# Patient Record
Sex: Male | Born: 1955 | Race: White | Hispanic: No | Marital: Married | Smoking: Never smoker
Health system: Southern US, Community
[De-identification: ages and names within clinical notes are randomized; demographics above are authoritative.]

## PROBLEM LIST (undated history)

## (undated) DIAGNOSIS — I10 Essential (primary) hypertension: Secondary | ICD-10-CM

## (undated) DIAGNOSIS — E7800 Pure hypercholesterolemia, unspecified: Secondary | ICD-10-CM

## (undated) HISTORY — PX: HIP SURGERY: SHX245

---

## 2003-08-12 ENCOUNTER — Inpatient Hospital Stay (HOSPITAL_COMMUNITY): Admission: RE | Admit: 2003-08-12 | Discharge: 2003-08-16 | Payer: Self-pay | Admitting: Orthopaedic Surgery

## 2004-09-18 ENCOUNTER — Ambulatory Visit (HOSPITAL_COMMUNITY): Admission: RE | Admit: 2004-09-18 | Discharge: 2004-09-18 | Payer: Self-pay | Admitting: General Surgery

## 2006-09-10 ENCOUNTER — Inpatient Hospital Stay (HOSPITAL_COMMUNITY): Admission: RE | Admit: 2006-09-10 | Discharge: 2006-09-12 | Payer: Self-pay | Admitting: Orthopaedic Surgery

## 2012-06-12 ENCOUNTER — Emergency Department (HOSPITAL_COMMUNITY): Payer: No Typology Code available for payment source

## 2012-06-12 ENCOUNTER — Emergency Department (HOSPITAL_COMMUNITY)
Admission: EM | Admit: 2012-06-12 | Discharge: 2012-06-12 | Disposition: A | Discharge status: Home / Self Care | Payer: No Typology Code available for payment source | Attending: Emergency Medicine | Admitting: Emergency Medicine

## 2012-06-12 ENCOUNTER — Encounter (HOSPITAL_COMMUNITY): Payer: Self-pay

## 2012-06-12 DIAGNOSIS — Y9210 Unspecified residential institution as the place of occurrence of the external cause: Secondary | ICD-10-CM | POA: Insufficient documentation

## 2012-06-12 DIAGNOSIS — S91009A Unspecified open wound, unspecified ankle, initial encounter: Secondary | ICD-10-CM | POA: Insufficient documentation

## 2012-06-12 DIAGNOSIS — Z23 Encounter for immunization: Secondary | ICD-10-CM | POA: Insufficient documentation

## 2012-06-12 DIAGNOSIS — I10 Essential (primary) hypertension: Secondary | ICD-10-CM | POA: Insufficient documentation

## 2012-06-12 DIAGNOSIS — S81009A Unspecified open wound, unspecified knee, initial encounter: Secondary | ICD-10-CM | POA: Insufficient documentation

## 2012-06-12 DIAGNOSIS — E7800 Pure hypercholesterolemia, unspecified: Secondary | ICD-10-CM | POA: Insufficient documentation

## 2012-06-12 DIAGNOSIS — Z79899 Other long term (current) drug therapy: Secondary | ICD-10-CM | POA: Insufficient documentation

## 2012-06-12 DIAGNOSIS — S81859A Open bite, unspecified lower leg, initial encounter: Secondary | ICD-10-CM

## 2012-06-12 DIAGNOSIS — W540XXA Bitten by dog, initial encounter: Secondary | ICD-10-CM | POA: Insufficient documentation

## 2012-06-12 DIAGNOSIS — Y9301 Activity, walking, marching and hiking: Secondary | ICD-10-CM | POA: Insufficient documentation

## 2012-06-12 HISTORY — DX: Essential (primary) hypertension: I10

## 2012-06-12 HISTORY — DX: Pure hypercholesterolemia, unspecified: E78.00

## 2012-06-12 MED ORDER — TETANUS-DIPHTH-ACELL PERTUSSIS 5-2.5-18.5 LF-MCG/0.5 IM SUSP
0.5000 mL | Freq: Once | INTRAMUSCULAR | Status: AC
  Administered 2012-06-12: 0.5 mL via INTRAMUSCULAR
  Filled 2012-06-12: qty 0.5

## 2012-06-12 MED ORDER — AMOXICILLIN-POT CLAVULANATE 875-125 MG PO TABS: 1.0000 | ORAL_TABLET | Freq: Two times a day (BID) | ORAL | Status: DC

## 2012-06-12 MED ORDER — RABIES IMMUNE GLOBULIN 150 UNIT/ML IM INJ
20.0000 [IU]/kg | INJECTION | Freq: Once | INTRAMUSCULAR | Status: AC
  Administered 2012-06-12: 1500 [IU]
  Filled 2012-06-12: qty 11.5

## 2012-06-12 MED ORDER — AMOXICILLIN-POT CLAVULANATE 875-125 MG PO TABS
1.0000 | ORAL_TABLET | Freq: Once | ORAL | Status: AC
  Administered 2012-06-12: 1 via ORAL
  Filled 2012-06-12: qty 1

## 2012-06-12 MED ORDER — RABIES VACCINE, PCEC IM SUSR
1.0000 mL | Freq: Once | INTRAMUSCULAR | Status: AC
  Administered 2012-06-12: 1 mL via INTRAMUSCULAR
  Filled 2012-06-12: qty 1

## 2012-06-12 NOTE — ED Notes (Signed)
Patient transported to X-ray 

## 2012-06-12 NOTE — ED Notes (Addendum)
Pt undressed from waist down, in gown, on continuous pulse oximetry and blood pressure cuff; blanket provided

## 2012-06-12 NOTE — ED Notes (Signed)
Pt was at vet office, walking thru lobby, dog on leash grabbed left lower leg, vaccines not up to date,

## 2012-06-12 NOTE — ED Notes (Signed)
Holding infiltration dose until better hemostasis, Xray notified pt ready for films

## 2012-06-12 NOTE — ED Notes (Signed)
Attempted to remove pressure dressing to give infiltration dose, blood squirted out of wound, pressure dressing reapplied and Dr Rulon Abide made aware, lac tray and lidocaine and 4.0 proline at the bedisde

## 2012-06-12 NOTE — ED Notes (Addendum)
Report from Vet clinic that animal has been collected by animal control, infiltration complete, no bleeding noted

## 2012-06-14 NOTE — ED Provider Notes (Addendum)
History     CSN: [id number]  Arrival date & time 06/12/12  1117   First MD Initiated Contact with Patient 06/12/12 1150      Chief Complaint  Patient presents with  . Animal Bite    (Consider location/radiation/quality/duration/timing/severity/associated sxs/prior treatment) HPIDouglas C Thompson is a 56 y.o. male who was at a veterinarian's office walking through the lobby when a lesion white pitbull lashed out and bit him on the left lower leg. Patient was treated at the veterinarian's office with irrigation and the wound was wrapped in a compressive dressing. Patient said the dog was not recently vaccinated for rabies.  Patient's having severe pain about 8-9/10 it is sharp and well localized, constant. He's got no coolness of the extremity distal to the wound, and no numbness or tingling of the left foot. This had no difficulties walking on the foot and no noticeable foot drop. He was otherwise feeling well before this incident today.   Past Medical History  Diagnosis Date  . Hypertension   . Hypercholesteremia     No past surgical history on file.  No family history on file.  History  Substance Use Topics  . Smoking status: Never Smoker   . Smokeless tobacco: Not on file  . Alcohol Use: Yes      Review of Systems At least 10pt or greater review of systems completed and are negative except where specified in the HPI.  Allergies  Review of patient's allergies indicates no known allergies.  Home Medications   Current Outpatient Rx  Name  Route  Sig  Dispense  Refill  . HYDROCHLOROTHIAZIDE 25 MG PO TABS   Oral   Take 25 mg by mouth daily.         Marland Kitchen SIMVASTATIN 20 MG PO TABS   Oral   Take 20 mg by mouth every evening.         Marland Kitchen AMOXICILLIN-POT CLAVULANATE 875-125 MG PO TABS   Oral   Take 1 tablet by mouth every 12 (twelve) hours.   14 tablet   0     BP 168/96  Pulse 88  Temp 98.3 F (36.8 C) (Oral)  Resp 20  Wt 190 lb (86.183 kg)  SpO2  99%  Physical Exam  Nursing notes reviewed.  Electronic medical record reviewed. VITAL SIGNS:   Filed Vitals:   06/12/12 1123 06/12/12 1300 06/12/12 1455  BP: 168/96    Pulse: 88    Temp: 98.3 F (36.8 C)    TempSrc: Oral    Resp: 20    Weight:  190 lb (86.183 kg) 190 lb (86.183 kg)  SpO2: 99%     CONSTITUTIONAL: Awake, oriented, appears non-toxic HENT: Atraumatic, normocephalic, oral mucosa pink and moist, airway patent. Nares patent without drainage. External ears normal. EYES: Conjunctiva clear, EOMI, PERRLA NECK: Trachea midline, non-tender, supple CARDIOVASCULAR: Normal heart rate, Normal rhythm, No murmurs, rubs, gallops PULMONARY/CHEST: Clear to auscultation, no rhonchi, wheezes, or rales. Symmetrical breath sounds. Non-tender. ABDOMINAL: Non-distended, soft, non-tender - no rebound or guarding.  BS normal. NEUROLOGIC: Non-focal, moving all four extremities, no gross sensory or motor deficits. EXTREMITIES: No clubbing, cyanosis, or edema. Deep puncture and avulsion wound to the left lateral lower leg. About 4 cm in length with 2 large puncture holes connected by a avulsed open tissue. There is brisk venous bleeding from this region. DP and PT pulses are 2+ bilaterally. Sensation is intact to the left lower extremity. He can dorsiflex and plantar flex the ankle and  toes SKIN: Warm, Dry, No erythema, No rash  ED Course  LACERATION REPAIR Performed by: Jones Skene Authorized by: Jones Skene Consent: Verbal consent obtained. Risks and benefits: risks, benefits and alternatives were discussed Consent given by: patient Body area: lower extremity Location details: left lower leg Laceration length: 4 cm Contamination: The wound is contaminated. (Dog bite) Foreign bodies: no foreign bodies Tendon involvement: none Nerve involvement: none Vascular damage: Brisk venous bleeding. Anesthesia: local infiltration Local anesthetic: lidocaine 2% with epinephrine Anesthetic  total: 10 ml Patient sedated: no Debridement: none Degree of undermining: none Skin closure: 4-0 Prolene Number of sutures: 1 Technique: simple (Horizontal mattress) Approximation difficulty: simple Dressing: pressure dressing Patient tolerance: Patient tolerated the procedure well with no immediate complications.   (including critical care time)  Labs Reviewed - No data to display No results found.   1. Animal bite of lower leg   2. Need for rabies vaccination       MDM  CHUCK CABAN is a 56 y.o. male presenting with some brisk bleeding from a dog bite wound. This was rinsed out and irrigated at the veterinary office. Patient has lost approximately 100 cc of blood from the wound which is also further cleaned it. Try to control her eating with a pressure dressing however it continued to bleed therefore he went to x-ray. Placed a single horizontal mattress stitch which controlled bleeding. Nursing placed rabies immunoglobulin in the wound after stitch was placed. No foreign body seen on x-ray. Patient given Augmentin in the ER a prescription for the same for 10 days. Patient was also given first of 4 rabies vaccines.  Patient will followup with urgent care for his continued rabies vaccine.  Is given very strict precautions to return in case the wound starts looking infected including but not limited to redness, worsening pain, streaks, draining pus. On the day of his last vaccine, told them to have them removed the single horizontal mattress stitch. Patient understands he may stop the rabies series it is confirmed that the dog is low risk or has been vaccinated.  I explained the diagnosis and have given explicit precautions to return to the ER including signs of infection as discussed before or any other new or worsening symptoms. The patient understands and accepts the medical plan as it's been dictated and I have answered their questions. Discharge instructions concerning home care  and prescriptions have been given.  The patient is STABLE and is discharged to home in good condition.      Jones Skene, MD 06/14/12 1616  Jones Skene, MD 06/16/12 1700

## 2012-06-17 ENCOUNTER — Emergency Department (INDEPENDENT_AMBULATORY_CARE_PROVIDER_SITE_OTHER)
Admission: EM | Admit: 2012-06-17 | Discharge: 2012-06-17 | Disposition: A | Discharge status: Home / Self Care | Payer: Commercial Managed Care - PPO | Source: Home / Self Care

## 2012-06-17 ENCOUNTER — Encounter (HOSPITAL_COMMUNITY): Payer: Self-pay | Admitting: *Deleted

## 2012-06-17 DIAGNOSIS — T148XXA Other injury of unspecified body region, initial encounter: Secondary | ICD-10-CM

## 2012-06-17 DIAGNOSIS — Z23 Encounter for immunization: Secondary | ICD-10-CM

## 2012-06-17 DIAGNOSIS — Z203 Contact with and (suspected) exposure to rabies: Secondary | ICD-10-CM

## 2012-06-17 DIAGNOSIS — W540XXA Bitten by dog, initial encounter: Secondary | ICD-10-CM

## 2012-06-17 MED ORDER — RABIES VACCINE, PCEC IM SUSR
INTRAMUSCULAR | Status: AC
  Filled 2012-06-17: qty 1

## 2012-06-17 MED ORDER — RABIES VACCINE, PCEC IM SUSR
1.0000 mL | Freq: Once | INTRAMUSCULAR | Status: AC
  Administered 2012-06-17: 1 mL via INTRAMUSCULAR

## 2012-06-17 NOTE — ED Notes (Signed)
Pt       Here   For  Rabies  Vaccine      Here  For         2  nd        Injection  Pt  Has   Sutures  In place     l  Lower  Leg

## 2012-06-17 NOTE — ED Notes (Signed)
Rabies schedule completed.  Injection dates adjusted to complete series in 14 day time period as pt hasn't rcvd day 3 shot.  Schedule faxed to [facility] and pharmacy x 2.  Note sent to [facility] asking them to provide pt with schedule when he returns today.

## 2012-06-21 ENCOUNTER — Encounter (HOSPITAL_COMMUNITY): Payer: Self-pay | Admitting: *Deleted

## 2012-06-21 ENCOUNTER — Emergency Department (HOSPITAL_COMMUNITY)
Admission: EM | Admit: 2012-06-21 | Discharge: 2012-06-21 | Disposition: A | Discharge status: Home / Self Care | Payer: Commercial Managed Care - PPO | Source: Home / Self Care | Attending: Emergency Medicine | Admitting: Emergency Medicine

## 2012-06-21 DIAGNOSIS — W540XXA Bitten by dog, initial encounter: Secondary | ICD-10-CM

## 2012-06-21 DIAGNOSIS — T148XXA Other injury of unspecified body region, initial encounter: Secondary | ICD-10-CM

## 2012-06-21 DIAGNOSIS — Z4802 Encounter for removal of sutures: Secondary | ICD-10-CM

## 2012-06-21 DIAGNOSIS — Z23 Encounter for immunization: Secondary | ICD-10-CM

## 2012-06-21 MED ORDER — RABIES VACCINE, PCEC IM SUSR
INTRAMUSCULAR | Status: AC
  Filled 2012-06-21: qty 1

## 2012-06-21 MED ORDER — RABIES VACCINE, PCEC IM SUSR
1.0000 mL | Freq: Once | INTRAMUSCULAR | Status: AC
  Administered 2012-06-21: 1 mL via INTRAMUSCULAR

## 2012-06-21 NOTE — ED Provider Notes (Signed)
Chief Complaint  Patient presents with  . Wound Check    History of Present Illness:  Billy Thompson is a 56 year old male who was bitten on his left leg by a dog on November 21. He was seen at the emergency room and a single stitch was put in to help stop the bleeding. Placed on antibiotics and a rabies series was begun. He returns today for suture removal. It's been healing up well without any evidence of infection. He's finished up his antibiotics. He did well with his first 2 rabies vaccines.  Review of Systems:  Other than noted above, the patient denies any of the following symptoms: Systemic:  No fever or chills. Musculoskeletal:  No joint pain or decreased range of motion. Neuro:  No numbness, tingling, or weakness.  PMFSH:  Past medical history, family history, social history, meds, and allergies were reviewed.  Physical Exam:   Vital signs:  BP 165/102  Pulse 66  Temp 98.1 F (36.7 C) (Oral)  Resp 16  SpO2 100% Ext:  He has a jagged laceration on his left, lower, lateral leg which appears to be healing up well without any evidence of infection. There is a single stitch in place.  All joints had a full ROM without pain.  Pulses were full.  Good capillary refill in all digits.  No edema. Neurological:  Alert and oriented.  No muscle weakness.  Sensation was intact to light touch.   Procedure: Verbal informed consent was obtained.  The patient was informed of the risks and benefits of the procedure and understands and accepts.  Identity of the patient was verified verbally and by wristband. The laceration was prepped with alcohol and the stitch was removed. Pressure was used to stop small amount of bleeding that occurred afterwards. Thereafter antibiotic ointment and a sterile pressure dressing were applied and he was instructed in wound care.  Medications given in [facility]:  He was given his third rabies vaccine today.  Assessment:  The encounter diagnosis was Dog bite.  Plan:   1.  The  following meds were prescribed:   New Prescriptions   No medications on file   2.  The patient was instructed in wound care and pain control, and handouts were given. 3.  The patient was told to return if any sign of infection, and also for his final rabies vaccine.     Reuben Likes, MD 06/21/12 [phone]

## 2012-06-21 NOTE — ED Notes (Signed)
Pt  Here  For  Rabies  Injection  As  Well  As   A   Suture  Removal

## 2012-06-21 NOTE — ED Notes (Signed)
No pain

## 2012-06-26 ENCOUNTER — Encounter (HOSPITAL_COMMUNITY): Payer: Self-pay | Admitting: Emergency Medicine

## 2012-06-26 ENCOUNTER — Emergency Department (INDEPENDENT_AMBULATORY_CARE_PROVIDER_SITE_OTHER)
Admission: EM | Admit: 2012-06-26 | Discharge: 2012-06-26 | Disposition: A | Discharge status: Home / Self Care | Payer: Commercial Managed Care - PPO | Source: Home / Self Care

## 2012-06-26 DIAGNOSIS — Z23 Encounter for immunization: Secondary | ICD-10-CM

## 2012-06-26 MED ORDER — RABIES VACCINE, PCEC IM SUSR
INTRAMUSCULAR | Status: AC
  Filled 2012-06-26: qty 1

## 2012-06-26 MED ORDER — RABIES VACCINE, PCEC IM SUSR
1.0000 mL | Freq: Once | INTRAMUSCULAR | Status: AC
  Administered 2012-06-26: 1 mL via INTRAMUSCULAR

## 2012-06-26 NOTE — ED Notes (Signed)
Pt here for rabies injection only.

## 2014-05-23 IMAGING — CR DG TIBIA/FIBULA 2V*L*
4 series · 4 of 4 positions shown · non-contrast
Comparison: None.

CLINICAL DATA: Dog bite to the lateral distal left lower leg

LEFT TIBIA AND FIBULA - 2 VIEW

[x tib-fib ap left (1 of 2)]
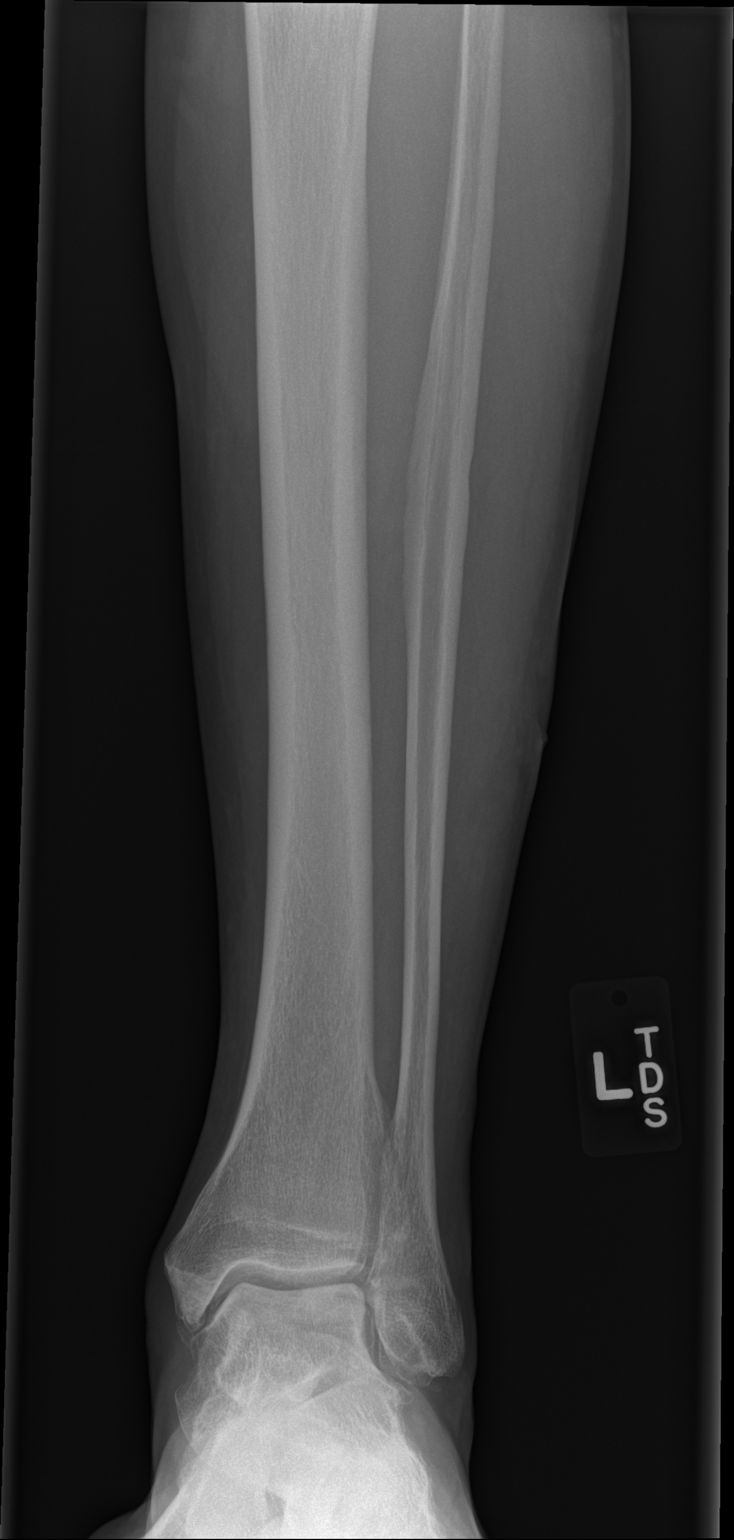

[x tib-fib ap left (2 of 2)]
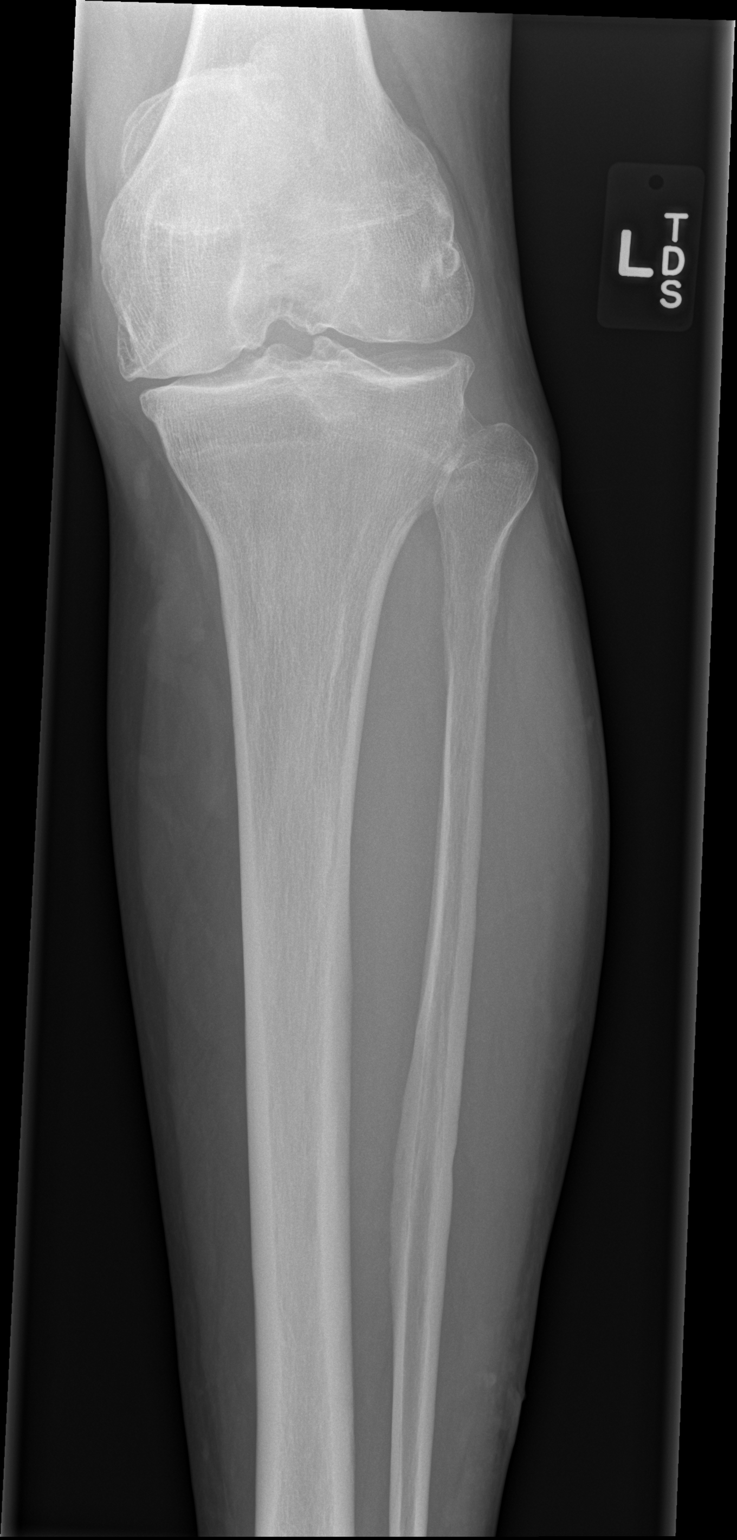

[x tib-fib lat left (1 of 2)]
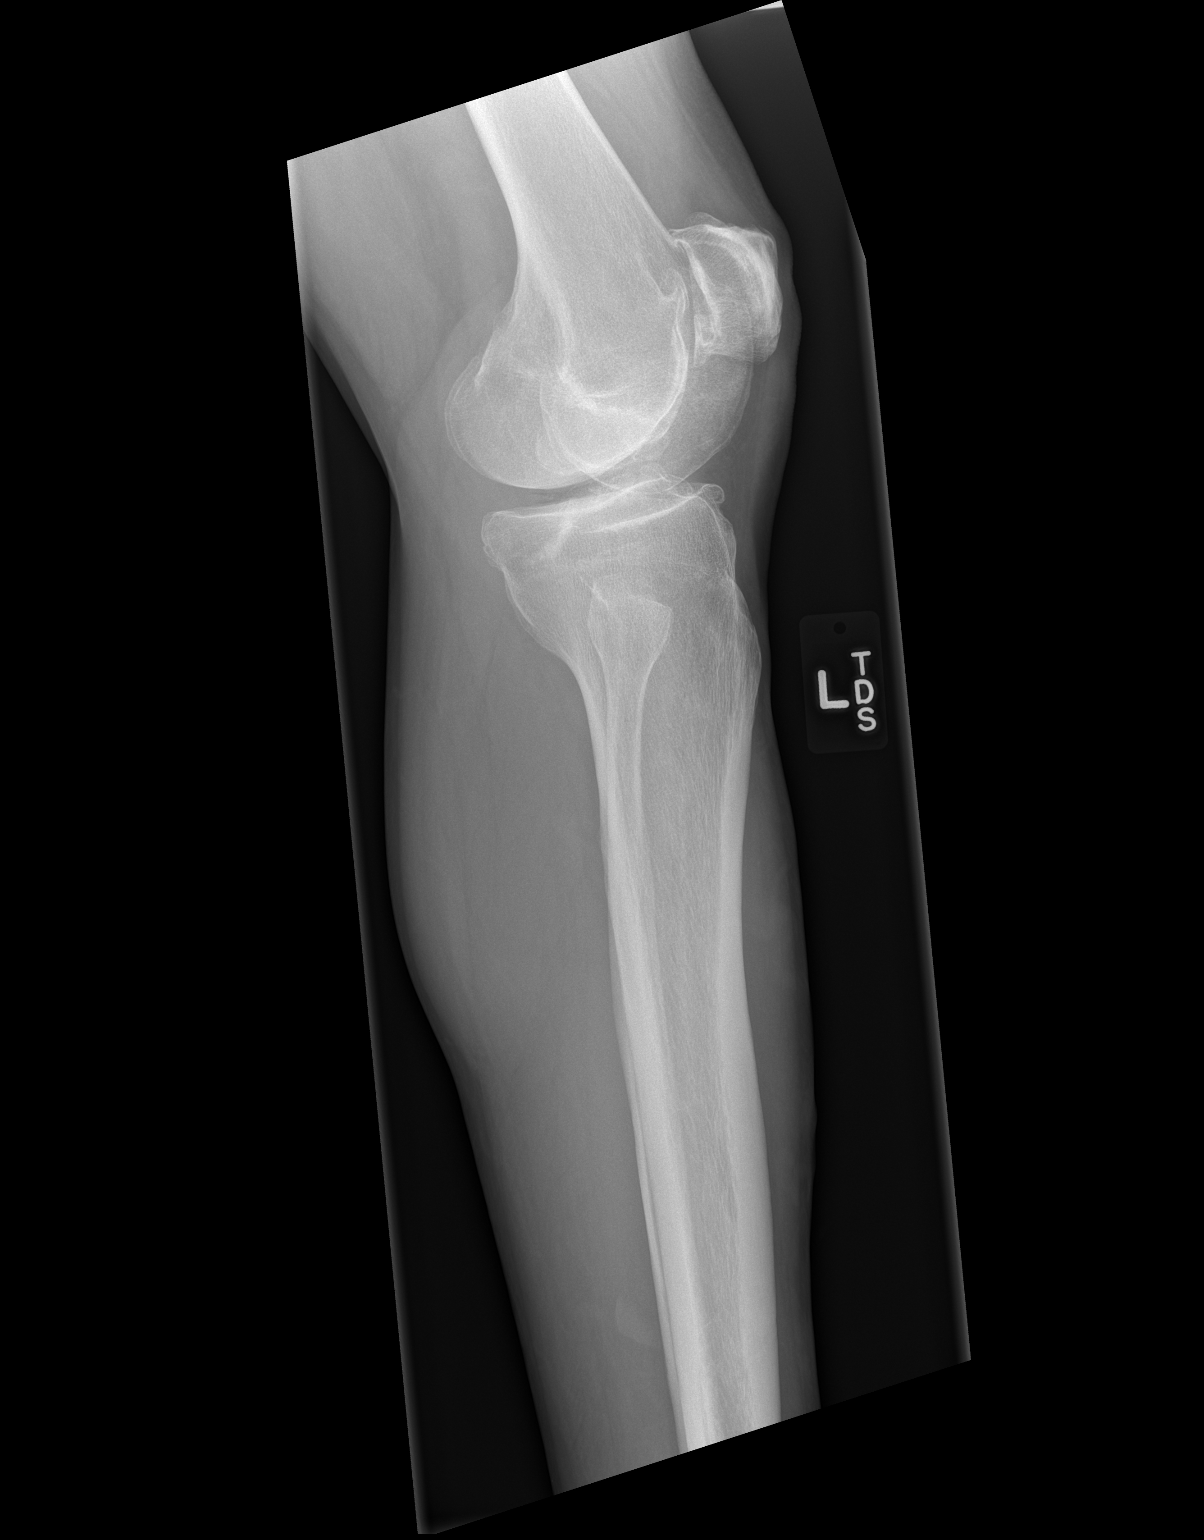

[x tib-fib lat left (2 of 2)]
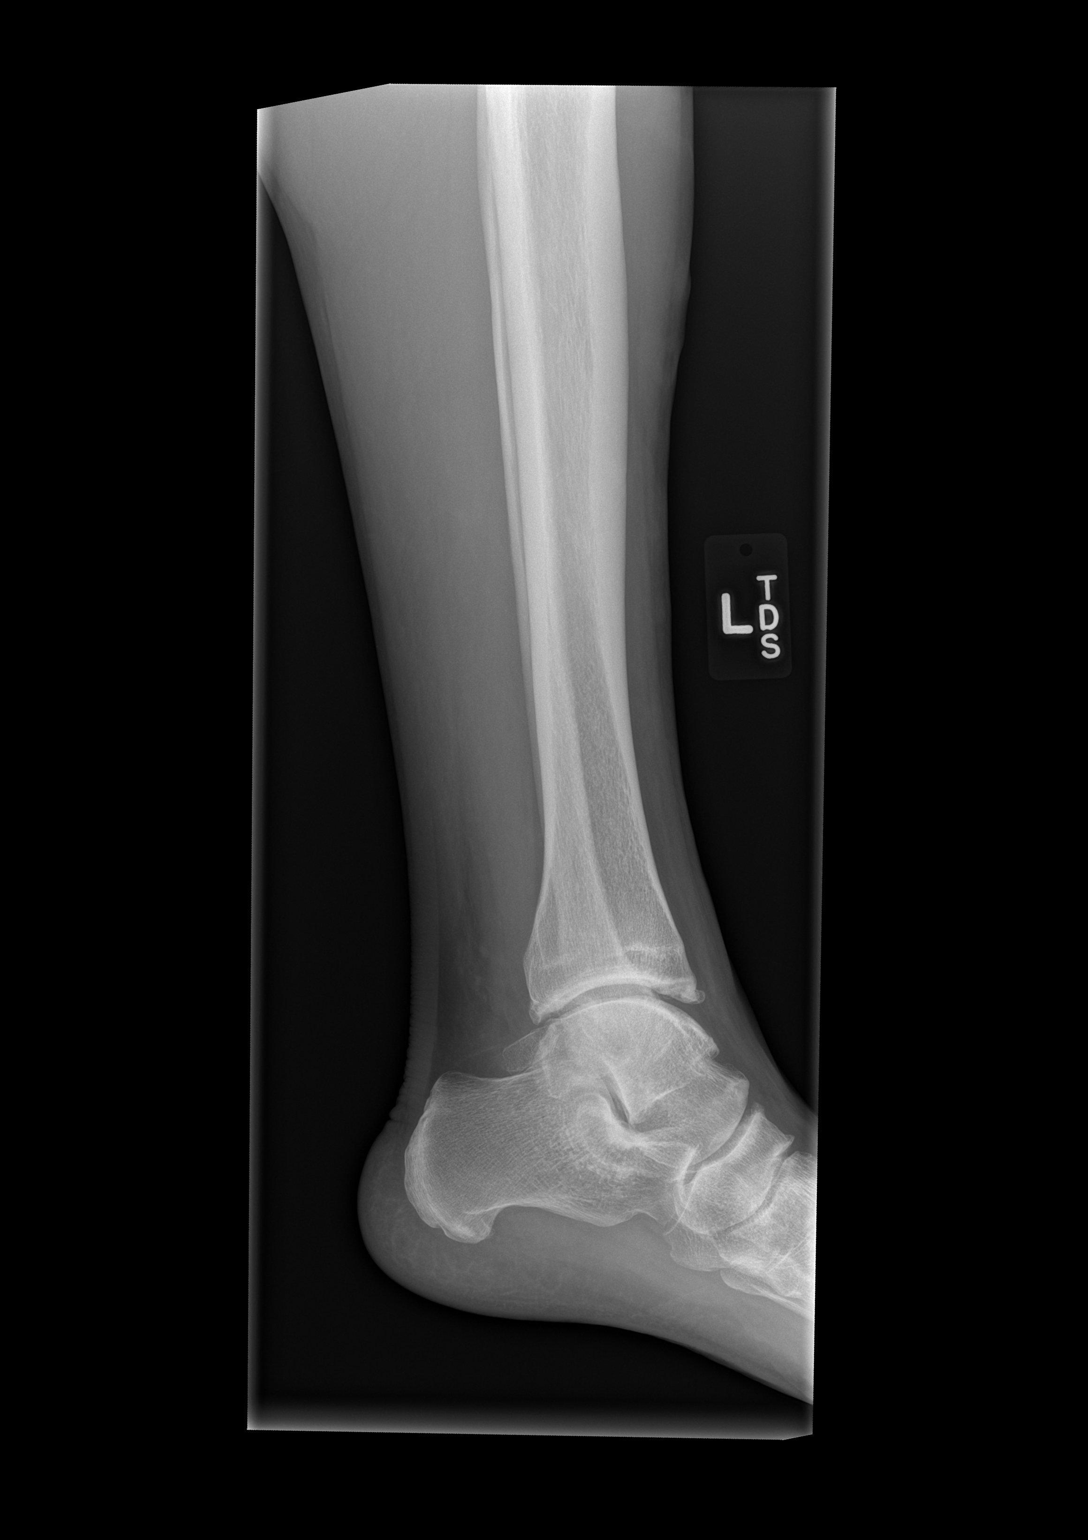

[4 of 4 positions shown; findings below may reference images not displayed]

FINDINGS: Degenerative change is noted in the left knee and in the
left ankle.  However, no acute fracture is seen. Alignment is
normal.
IMPRESSION: No acute bony abnormality.  Degenerative change in the left knee
and left ankle

## 2016-11-01 DIAGNOSIS — H5213 Myopia, bilateral: Secondary | ICD-10-CM | POA: Diagnosis not present

## 2016-11-01 DIAGNOSIS — H52223 Regular astigmatism, bilateral: Secondary | ICD-10-CM | POA: Diagnosis not present

## 2016-12-12 DIAGNOSIS — I10 Essential (primary) hypertension: Secondary | ICD-10-CM | POA: Diagnosis not present

## 2016-12-12 DIAGNOSIS — E7800 Pure hypercholesterolemia, unspecified: Secondary | ICD-10-CM | POA: Diagnosis not present

## 2017-06-11 DIAGNOSIS — E7800 Pure hypercholesterolemia, unspecified: Secondary | ICD-10-CM | POA: Diagnosis not present

## 2017-06-11 DIAGNOSIS — Z0000 Encounter for general adult medical examination without abnormal findings: Secondary | ICD-10-CM | POA: Diagnosis not present

## 2017-06-11 DIAGNOSIS — I10 Essential (primary) hypertension: Secondary | ICD-10-CM | POA: Diagnosis not present

## 2017-06-11 DIAGNOSIS — Z125 Encounter for screening for malignant neoplasm of prostate: Secondary | ICD-10-CM | POA: Diagnosis not present

## 2017-07-04 ENCOUNTER — Encounter (INDEPENDENT_AMBULATORY_CARE_PROVIDER_SITE_OTHER): Payer: Self-pay | Admitting: Orthopaedic Surgery

## 2017-07-04 ENCOUNTER — Ambulatory Visit (INDEPENDENT_AMBULATORY_CARE_PROVIDER_SITE_OTHER): Payer: Self-pay

## 2017-07-04 ENCOUNTER — Ambulatory Visit (INDEPENDENT_AMBULATORY_CARE_PROVIDER_SITE_OTHER): Payer: Commercial Managed Care - PPO | Admitting: Orthopaedic Surgery

## 2017-07-04 VITALS — Resp 14 | Ht 72.0 in | Wt 200.0 lb

## 2017-07-04 DIAGNOSIS — M25551 Pain in right hip: Secondary | ICD-10-CM | POA: Diagnosis not present

## 2017-07-04 NOTE — Progress Notes (Signed)
Office Visit Note   Patient: Billy Thompson           Date of Birth: March 08, 1956           MRN: [id number] Visit Date: 07/04/2017              Requested by: No referring provider defined for this encounter. PCP: Billy Thompson, Billy Saucerean, MD   Assessment & Plan: Visit Diagnoses:  1. Pain of right hip joint     Plan: Probable nondisplaced fracture of greater trochanter right hip. Continue with one crutch in left upper extremity. Ice or heat and over-the-counter medicines for pain control. Office 3-4 weeks if no improvement  Follow-Up Instructions: Return if symptoms worsen or fail to improve.   Orders:  Orders Placed This Encounter  Procedures  . XR Pelvis 1-2 Views   No orders of the defined types were placed in this encounter.     Procedures: No procedures performed   Clinical Data: No additional findings.   Subjective: Chief Complaint  Patient presents with  . Right Hip - Pain, Edema    Billy Thompson is a 61 y o here today because he fell onto black ice on Wednesday (Yesterday.)  Billy Thompson, fell directly on the lateral aspect of his right hip yesterday in the ice. He's having difficulty bearing weight and his right lower extremity without using a single crutch in his left hand. Not having any groin or thigh pain. Pain is localized over the greater trochanter of his right hip. No back pain. No numbness or tingling some swelling over the trochanter SAB with ice and has resolved  HPI  Review of Systems  Constitutional: Negative for fatigue.  HENT: Negative for hearing loss.   Respiratory: Negative for apnea, chest tightness and shortness of breath.   Cardiovascular: Negative for chest pain, palpitations and leg swelling.  Gastrointestinal: Negative for blood in stool, constipation and diarrhea.  Genitourinary: Negative for difficulty urinating.  Musculoskeletal: Positive for joint swelling. Negative for arthralgias, back pain, myalgias, neck pain and neck stiffness.    Neurological: Negative for weakness, numbness and headaches.  Hematological: Does not bruise/bleed easily.  Psychiatric/Behavioral: Negative for sleep disturbance. The patient is not nervous/anxious.      Objective: Vital Signs: Resp 14   Ht 6' (1.829 m)   Wt 200 lb (90.7 kg)   BMI 27.12 kg/m   Physical Exam  Ortho Exam painless range of motion groin and anterior thigh right hip with range of motion. Some local tenderness over the greater trochanter with her some mild swelling. Neurovascular exam is intact. Leg lengths symmetrical. Straight leg raise negative. No specialty comments available.  Imaging: Xr Pelvis 1-2 Views  Result Date: 07/04/2017 Several views of the pelvis and right hip are obtained. There is some irregularity about the right greater trochanter were patient is symptomatic. There is some ectopic calcification or possibly a nondisplaced crack in the trochanter. Hip prosthesis intact. No evidence of fracture about the femoral shaft or the acetabulum    PMFS History: There are no active problems to display for this patient.  Past Medical History:  Diagnosis Date  . Hypercholesteremia   . Hypertension     History reviewed. No pertinent family history.  Past Surgical History:  Procedure Laterality Date  . HIP SURGERY Right    replacement  . HIP SURGERY Left    replacement   Social History   Occupational History  . Not on file  Tobacco Use  . Smoking status: Never  Smoker  . Smokeless tobacco: Never Used  Substance and Sexual Activity  . Alcohol use: Yes  . Drug use: No  . Sexual activity: Not on file

## 2017-08-05 DIAGNOSIS — G479 Sleep disorder, unspecified: Secondary | ICD-10-CM | POA: Diagnosis not present

## 2017-08-05 DIAGNOSIS — R0683 Snoring: Secondary | ICD-10-CM | POA: Diagnosis not present

## 2017-09-19 DIAGNOSIS — G4733 Obstructive sleep apnea (adult) (pediatric): Secondary | ICD-10-CM | POA: Diagnosis not present

## 2017-11-14 DIAGNOSIS — H52223 Regular astigmatism, bilateral: Secondary | ICD-10-CM | POA: Diagnosis not present

## 2017-11-14 DIAGNOSIS — H524 Presbyopia: Secondary | ICD-10-CM | POA: Diagnosis not present

## 2017-11-14 DIAGNOSIS — H5213 Myopia, bilateral: Secondary | ICD-10-CM | POA: Diagnosis not present

## 2018-01-07 DIAGNOSIS — I10 Essential (primary) hypertension: Secondary | ICD-10-CM | POA: Diagnosis not present

## 2018-01-07 DIAGNOSIS — E7800 Pure hypercholesterolemia, unspecified: Secondary | ICD-10-CM | POA: Diagnosis not present

## 2019-05-11 ENCOUNTER — Other Ambulatory Visit: Payer: Self-pay

## 2019-05-11 DIAGNOSIS — Z20822 Contact with and (suspected) exposure to covid-19: Secondary | ICD-10-CM

## 2019-05-13 LAB — NOVEL CORONAVIRUS, NAA: SARS-CoV-2, NAA: NOT DETECTED

## 2019-05-19 ENCOUNTER — Telehealth: Payer: Self-pay | Admitting: General Practice

## 2019-05-19 NOTE — Telephone Encounter (Signed)
Gave patient negative covid test results Patient understood 

## 2020-03-08 ENCOUNTER — Other Ambulatory Visit: Payer: Self-pay

## 2020-03-08 DIAGNOSIS — Z20822 Contact with and (suspected) exposure to covid-19: Secondary | ICD-10-CM

## 2020-03-09 LAB — NOVEL CORONAVIRUS, NAA: SARS-CoV-2, NAA: NOT DETECTED

## 2020-03-09 LAB — SARS-COV-2, NAA 2 DAY TAT

## 2020-04-25 ENCOUNTER — Other Ambulatory Visit: Payer: Self-pay

## 2020-04-25 DIAGNOSIS — Z20822 Contact with and (suspected) exposure to covid-19: Secondary | ICD-10-CM

## 2020-04-26 LAB — SARS-COV-2, NAA 2 DAY TAT

## 2020-04-26 LAB — NOVEL CORONAVIRUS, NAA: SARS-CoV-2, NAA: NOT DETECTED

## 2020-04-29 ENCOUNTER — Other Ambulatory Visit: Payer: Self-pay

## 2020-04-29 DIAGNOSIS — Z20822 Contact with and (suspected) exposure to covid-19: Secondary | ICD-10-CM

## 2020-05-01 LAB — NOVEL CORONAVIRUS, NAA: SARS-CoV-2, NAA: NOT DETECTED

## 2020-05-01 LAB — SARS-COV-2, NAA 2 DAY TAT

## 2020-12-15 ENCOUNTER — Other Ambulatory Visit: Payer: Self-pay

## 2020-12-15 ENCOUNTER — Other Ambulatory Visit (HOSPITAL_BASED_OUTPATIENT_CLINIC_OR_DEPARTMENT_OTHER): Payer: Self-pay

## 2020-12-15 ENCOUNTER — Ambulatory Visit: Payer: Self-pay | Attending: Internal Medicine

## 2020-12-15 DIAGNOSIS — Z23 Encounter for immunization: Secondary | ICD-10-CM

## 2020-12-15 MED ORDER — COVID-19 MRNA VACC (MODERNA) 100 MCG/0.5ML IM SUSP
INTRAMUSCULAR | 0 refills | Status: DC
  Filled 2020-12-15: qty 0.25, 1d supply, fill #0

## 2020-12-15 NOTE — Progress Notes (Signed)
   Covid-19 Vaccination Clinic  Name:  Billy Thompson    MRN: [id number] DOB: 1955/12/18  12/15/2020  Billy Thompson was observed post Covid-19 immunization for 15 minutes without incident. He was provided with Vaccine Information Sheet and instruction to access the V-Safe system.   Billy Thompson was instructed to call 911 with any severe reactions post vaccine: Marland Kitchen Difficulty breathing  . Swelling of face and throat  . A fast heartbeat  . A bad rash all over body  . Dizziness and weakness   Immunizations Administered    Name Date Dose VIS Date Route   Moderna Covid-19 Booster Vaccine 12/15/2020 12:21 PM 0.25 mL 05/11/2020 Intramuscular   Manufacturer: Moderna   Lot: [id number]   NDC: 27741-287-86

## 2022-05-04 ENCOUNTER — Other Ambulatory Visit (HOSPITAL_COMMUNITY): Payer: Self-pay

## 2023-01-11 ENCOUNTER — Inpatient Hospital Stay (HOSPITAL_COMMUNITY)
Admission: EM | Admit: 2023-01-11 | Discharge: 2023-01-21 | DRG: 101 | Disposition: E | Payer: Managed Care, Other (non HMO) | Attending: Emergency Medicine | Admitting: Emergency Medicine

## 2023-01-11 ENCOUNTER — Emergency Department (HOSPITAL_COMMUNITY): Payer: Managed Care, Other (non HMO)

## 2023-01-11 ENCOUNTER — Inpatient Hospital Stay (HOSPITAL_COMMUNITY): Payer: Managed Care, Other (non HMO)

## 2023-01-11 DIAGNOSIS — I469 Cardiac arrest, cause unspecified: Secondary | ICD-10-CM | POA: Diagnosis not present

## 2023-01-11 DIAGNOSIS — Z72820 Sleep deprivation: Secondary | ICD-10-CM

## 2023-01-11 DIAGNOSIS — E7800 Pure hypercholesterolemia, unspecified: Secondary | ICD-10-CM | POA: Diagnosis present

## 2023-01-11 DIAGNOSIS — R400 Somnolence: Secondary | ICD-10-CM | POA: Diagnosis not present

## 2023-01-11 DIAGNOSIS — Y92009 Unspecified place in unspecified non-institutional (private) residence as the place of occurrence of the external cause: Secondary | ICD-10-CM | POA: Diagnosis not present

## 2023-01-11 DIAGNOSIS — Z7982 Long term (current) use of aspirin: Secondary | ICD-10-CM

## 2023-01-11 DIAGNOSIS — I468 Cardiac arrest due to other underlying condition: Secondary | ICD-10-CM | POA: Diagnosis present

## 2023-01-11 DIAGNOSIS — T424X5A Adverse effect of benzodiazepines, initial encounter: Secondary | ICD-10-CM | POA: Diagnosis present

## 2023-01-11 DIAGNOSIS — G9349 Other encephalopathy: Secondary | ICD-10-CM | POA: Diagnosis present

## 2023-01-11 DIAGNOSIS — T426X5A Adverse effect of other antiepileptic and sedative-hypnotic drugs, initial encounter: Secondary | ICD-10-CM | POA: Diagnosis present

## 2023-01-11 DIAGNOSIS — R4182 Altered mental status, unspecified: Secondary | ICD-10-CM | POA: Diagnosis present

## 2023-01-11 DIAGNOSIS — Z96659 Presence of unspecified artificial knee joint: Secondary | ICD-10-CM

## 2023-01-11 DIAGNOSIS — R739 Hyperglycemia, unspecified: Secondary | ICD-10-CM | POA: Diagnosis present

## 2023-01-11 DIAGNOSIS — R29721 NIHSS score 21: Secondary | ICD-10-CM | POA: Diagnosis present

## 2023-01-11 DIAGNOSIS — R000 Tachycardia, unspecified: Secondary | ICD-10-CM | POA: Diagnosis present

## 2023-01-11 DIAGNOSIS — I10 Essential (primary) hypertension: Secondary | ICD-10-CM | POA: Diagnosis present

## 2023-01-11 DIAGNOSIS — W19XXXA Unspecified fall, initial encounter: Secondary | ICD-10-CM | POA: Diagnosis present

## 2023-01-11 DIAGNOSIS — Z9889 Other specified postprocedural states: Secondary | ICD-10-CM | POA: Diagnosis not present

## 2023-01-11 DIAGNOSIS — R569 Unspecified convulsions: Secondary | ICD-10-CM | POA: Diagnosis present

## 2023-01-11 DIAGNOSIS — R0682 Tachypnea, not elsewhere classified: Secondary | ICD-10-CM | POA: Diagnosis present

## 2023-01-11 DIAGNOSIS — Z96643 Presence of artificial hip joint, bilateral: Secondary | ICD-10-CM | POA: Diagnosis present

## 2023-01-11 DIAGNOSIS — R001 Bradycardia, unspecified: Secondary | ICD-10-CM | POA: Diagnosis present

## 2023-01-11 DIAGNOSIS — Z79899 Other long term (current) drug therapy: Secondary | ICD-10-CM | POA: Diagnosis not present

## 2023-01-11 DIAGNOSIS — E785 Hyperlipidemia, unspecified: Secondary | ICD-10-CM | POA: Diagnosis present

## 2023-01-11 LAB — DIFFERENTIAL
Abs Immature Granulocytes: 0.06 10*3/uL (ref 0.00–0.07)
Basophils Absolute: 0 10*3/uL (ref 0.0–0.1)
Basophils Relative: 1 %
Eosinophils Absolute: 0.3 10*3/uL (ref 0.0–0.5)
Eosinophils Relative: 4 %
Immature Granulocytes: 1 %
Lymphocytes Relative: 55 %
Lymphs Abs: 3.7 10*3/uL (ref 0.7–4.0)
Monocytes Absolute: 0.3 10*3/uL (ref 0.1–1.0)
Monocytes Relative: 5 %
Neutro Abs: 2.2 10*3/uL (ref 1.7–7.7)
Neutrophils Relative %: 34 %

## 2023-01-11 LAB — CBC
HCT: 35.8 % — ABNORMAL LOW (ref 39.0–52.0)
Hemoglobin: 10.8 g/dL — ABNORMAL LOW (ref 13.0–17.0)
MCH: 30.1 pg (ref 26.0–34.0)
MCHC: 30.2 g/dL (ref 30.0–36.0)
MCV: 99.7 fL (ref 80.0–100.0)
Platelets: 321 10*3/uL (ref 150–400)
RBC: 3.59 MIL/uL — ABNORMAL LOW (ref 4.22–5.81)
RDW: 13.2 % (ref 11.5–15.5)
WBC: 6.6 10*3/uL (ref 4.0–10.5)
nRBC: 0 % (ref 0.0–0.2)

## 2023-01-11 LAB — I-STAT CHEM 8, ED
BUN: 9 mg/dL (ref 8–23)
Calcium, Ion: 1.13 mmol/L — ABNORMAL LOW (ref 1.15–1.40)
Chloride: 104 mmol/L (ref 98–111)
Creatinine, Ser: 1.1 mg/dL (ref 0.61–1.24)
Glucose, Bld: 212 mg/dL — ABNORMAL HIGH (ref 70–99)
HCT: 32 % — ABNORMAL LOW (ref 39.0–52.0)
Hemoglobin: 10.9 g/dL — ABNORMAL LOW (ref 13.0–17.0)
Potassium: 3.6 mmol/L (ref 3.5–5.1)
Sodium: 132 mmol/L — ABNORMAL LOW (ref 135–145)
TCO2: 16 mmol/L — ABNORMAL LOW (ref 22–32)

## 2023-01-11 LAB — COMPREHENSIVE METABOLIC PANEL
ALT: 12 U/L (ref 0–44)
AST: 23 U/L (ref 15–41)
Albumin: 3.3 g/dL — ABNORMAL LOW (ref 3.5–5.0)
Alkaline Phosphatase: 54 U/L (ref 38–126)
Anion gap: 14 (ref 5–15)
BUN: 9 mg/dL (ref 8–23)
CO2: 15 mmol/L — ABNORMAL LOW (ref 22–32)
Calcium: 8.9 mg/dL (ref 8.9–10.3)
Chloride: 102 mmol/L (ref 98–111)
Creatinine, Ser: 1.29 mg/dL — ABNORMAL HIGH (ref 0.61–1.24)
GFR, Estimated: >60 mL/min (ref >60)
Glucose, Bld: 223 mg/dL — ABNORMAL HIGH (ref 70–99)
Potassium: 3.6 mmol/L (ref 3.5–5.1)
Sodium: 131 mmol/L — ABNORMAL LOW (ref 135–145)
Total Bilirubin: 0.8 mg/dL (ref 0.3–1.2)
Total Protein: 5.6 g/dL — ABNORMAL LOW (ref 6.5–8.1)

## 2023-01-11 LAB — PROTIME-INR
INR: 1.1 (ref 0.8–1.2)
Prothrombin Time: 14.1 seconds (ref 11.4–15.2)

## 2023-01-11 LAB — I-STAT ARTERIAL BLOOD GAS, ED
Acid-base deficit: 22 mmol/L — ABNORMAL HIGH (ref 0.0–2.0)
Bicarbonate: 7.9 mmol/L — ABNORMAL LOW (ref 20.0–28.0)
Calcium, Ion: 1.19 mmol/L (ref 1.15–1.40)
HCT: 24 % — ABNORMAL LOW (ref 39.0–52.0)
Hemoglobin: 8.2 g/dL — ABNORMAL LOW (ref 13.0–17.0)
O2 Saturation: 100 %
Patient temperature: 97.6
Potassium: 4.7 mmol/L (ref 3.5–5.1)
Sodium: 130 mmol/L — ABNORMAL LOW (ref 135–145)
TCO2: 9 mmol/L — ABNORMAL LOW (ref 22–32)
pCO2 arterial: 30.1 mmHg — ABNORMAL LOW (ref 32–48)
pH, Arterial: 7.022 — CL (ref 7.35–7.45)
pO2, Arterial: 410 mmHg — ABNORMAL HIGH (ref 83–108)

## 2023-01-11 LAB — APTT: aPTT: 23 seconds — ABNORMAL LOW (ref 24–36)

## 2023-01-11 LAB — ETHANOL: Alcohol, Ethyl (B): <10 mg/dL (ref <10)

## 2023-01-11 LAB — CBG MONITORING, ED: Glucose-Capillary: 171 mg/dL — ABNORMAL HIGH (ref 70–99)

## 2023-01-11 MED ORDER — LEVETIRACETAM IN NACL 500 MG/100ML IV SOLN
500.0000 mg | Freq: Once | INTRAVENOUS | Status: AC
  Administered 2023-01-11: 500 mg via INTRAVENOUS

## 2023-01-11 MED ORDER — SODIUM CHLORIDE 0.9 % IV SOLN
4500.0000 mg | Freq: Once | INTRAVENOUS | Status: DC
  Filled 2023-01-11: qty 45

## 2023-01-11 MED ORDER — LEVETIRACETAM IN NACL 1000 MG/100ML IV SOLN
1000.0000 mg | Freq: Once | INTRAVENOUS | Status: AC
  Administered 2023-01-11: 1000 mg via INTRAVENOUS

## 2023-01-11 MED ORDER — LORAZEPAM 2 MG/ML IJ SOLN
INTRAMUSCULAR | Status: AC
  Filled 2023-01-11: qty 1

## 2023-01-11 MED ORDER — NOREPINEPHRINE 4 MG/250ML-% IV SOLN
INTRAVENOUS | Status: AC | PRN
  Administered 2023-01-11: 10 ug/min via INTRAVENOUS

## 2023-01-11 MED ORDER — LEVETIRACETAM IN NACL 1500 MG/100ML IV SOLN
1500.0000 mg | Freq: Once | INTRAVENOUS | Status: AC
  Administered 2023-01-11: 1500 mg via INTRAVENOUS
  Filled 2023-01-11: qty 100

## 2023-01-11 MED ORDER — IOHEXOL 350 MG/ML SOLN
75.0000 mL | Freq: Once | INTRAVENOUS | Status: AC | PRN
  Administered 2023-01-11: 75 mL via INTRAVENOUS

## 2023-01-11 MED ORDER — LORAZEPAM 2 MG/ML IJ SOLN
2.0000 mg | Freq: Once | INTRAMUSCULAR | Status: AC
  Administered 2023-01-11: 2 mg via INTRAVENOUS

## 2023-01-11 MED ORDER — EPINEPHRINE 1 MG/10ML IJ SOSY
PREFILLED_SYRINGE | INTRAMUSCULAR | Status: AC | PRN
  Administered 2023-01-11 (×4): 1 mg via INTRAVENOUS

## 2023-01-11 MED ORDER — DOCUSATE SODIUM 100 MG PO CAPS: 100.0000 mg | ORAL_CAPSULE | Freq: Two times a day (BID) | ORAL | Status: DC | PRN

## 2023-01-11 MED ORDER — LACTATED RINGERS IV BOLUS
1000.0000 mL | Freq: Once | INTRAVENOUS | Status: AC
  Administered 2023-01-11: 1000 mL via INTRAVENOUS

## 2023-01-11 MED ORDER — SODIUM CHLORIDE 0.9 % IV SOLN: INTRAVENOUS | Status: DC

## 2023-01-11 MED ORDER — LEVETIRACETAM IN NACL 1000 MG/100ML IV SOLN: 1000.0000 mg | Freq: Two times a day (BID) | INTRAVENOUS | Status: DC

## 2023-01-11 MED ORDER — POLYETHYLENE GLYCOL 3350 17 G PO PACK: 17.0000 g | PACK | Freq: Every day | ORAL | Status: DC | PRN

## 2023-01-11 MED ORDER — SODIUM BICARBONATE 8.4 % IV SOLN
INTRAVENOUS | Status: AC | PRN
  Administered 2023-01-11: 100 meq via INTRAVENOUS

## 2023-01-11 MED ORDER — SODIUM BICARBONATE 8.4 % IV SOLN: 50.0000 meq | Freq: Once | INTRAVENOUS | Status: DC

## 2023-01-21 NOTE — Procedures (Signed)
Intubation Procedure Note Billy Thompson [id number] 1955-12-03  Procedure: Intubation Indications: Respiratory insufficiency  Procedure Details Consent: Unable to obtain consent because of emergent medical necessity. Time Out: Verified patient identification, verified procedure, site/side was marked, verified correct patient position, special equipment/implants available, medications/allergies/relevent history reviewed, required imaging and test results available.    MAC and 4 Medications:none  Fentanyl  Etomidate Versed NMB    Evaluation Hemodynamic Status: expired; O2 sats:  expired Pt expired   Brett Canales Shereda Graw ACNP [facility] PCCM Pager [phone] till 3 pm If no answer page [phone] 01/20/2023, 10:58 AM

## 2023-01-21 NOTE — Code Documentation (Signed)
Patient time of death occurred at 1049

## 2023-01-21 NOTE — Death Summary Note (Signed)
DEATH SUMMARY   Patient Details  Name: Billy Thompson MRN: [id number] DOB: [birth year]  Admission/Discharge Information   Admit Date:  01-18-23  Date of Death: Date of Death: 01-18-2023  Time of Death: Time of Death: 1049  Length of Stay: 1  Referring Physician: Clovis Riley, L.August Saucer, MD   Reason(s) for Hospitalization  Seizures  Diagnoses  Preliminary cause of death:  Cardiac arrest, PEA arrest due to seizures, possible respiratory suppression  Secondary Diagnoses (including complications and co-morbidities):  Principal Problem:   Cardiac arrest [facility]) Active Problems:   AMS (altered mental status)   New onset seizure (HCC)   Hypertension   Hyperlipidemia   S/P knee replacement   Brief Hospital Course (including significant findings, care, treatment, and services provided and events leading to death)  Billy Thompson is a 67 y.o. year old male with a history of hypertension hyperlipidemia, recent knee replacement surgery last month. He became unconscious at home, fall heard by his wife. He was encephalopathic, exhibited rhythmic movements of the bilateral lower extremities consistent with seizure activity. No known history of seizures. EMS was activated and he was brought into the ED with an LMA in place. Head CT was performed that was reassuring. He showed improvement in his mental status, was able to follow commands and the LMA was removed. He then had another witnessed seizure in the ED, received Keppra load and 2 mg of lorazepam. He was confused and postictal. Plan was in place for him to be admitted to the ICU for further care. Unfortunately in the ED he then developed bradycardia and PEA. CPR was initiated, bag mask ventilation and then intubation with bag ventilation accomplished quickly. Unfortunately despite multiple rounds of CPR, bicarbonate, addition of norepinephrine, perfusing rhythm was never reobtained. CPR was ceased and the patient expired. Cause of his PEA was  unclear. Most likely cause was felt to be respiratory suppression in the postictal state from Keppra and Ativan, but he did not respond to very quick ventilation and restoration of oxygenation. No other clear cause of PEA was identified. POCUS did show agonal cardiac motion despite sinus bradycardia by monitor. I discussed these events with the patient's wife and escorted her to the room to be with her husband after he had passed     Pertinent Labs and Studies  Significant Diagnostic Studies CT C-SPINE NO CHARGE  Result Date: 01-18-23 CLINICAL DATA:  Multiple falls at home. EXAM: CT CERVICAL SPINE WITHOUT CONTRAST TECHNIQUE: Multidetector CT imaging of the cervical spine was performed without intravenous contrast. Multiplanar CT image reconstructions were also generated. RADIATION DOSE REDUCTION: This exam was performed according to the departmental dose-optimization program which includes automated exposure control, adjustment of the mA and/or kV according to patient size and/or use of iterative reconstruction technique. COMPARISON:  None Available. FINDINGS: Alignment: Within limits of patient motion artifact, no evidence of traumatic malalignment. Skull base and vertebrae: Within limits of patient motion artifact, no acute fracture. Soft tissues and spinal canal: Within limits of patient motion artifact, no prevertebral edema. Disc levels: Multilevel cervical spondylosis, worst at C5-6, where there is at least mild spinal canal stenosis. Upper chest: Unremarkable. Other: None. IMPRESSION: 1. Within limits of patient motion artifact, no acute cervical spine fracture or traumatic malalignment. 2. Multilevel cervical spondylosis, worst at C5-6 where there is at least mild spinal canal stenosis. Electronically Signed   By: Orvan Falconer M.D.   On: 01/18/2023 09:37   CT ANGIO HEAD NECK W WO CM (CODE STROKE)  Result  Date: 02/04/2023 CLINICAL DATA:  Neuro deficit, acute, stroke suspected. EXAM: CT  ANGIOGRAPHY HEAD AND NECK WITH AND WITHOUT CONTRAST TECHNIQUE: Multidetector CT imaging of the head and neck was performed using the standard protocol during bolus administration of intravenous contrast. Multiplanar CT image reconstructions and MIPs were obtained to evaluate the vascular anatomy. Carotid stenosis measurements (when applicable) are obtained utilizing NASCET criteria, using the distal internal carotid diameter as the denominator. RADIATION DOSE REDUCTION: This exam was performed according to the departmental dose-optimization program which includes automated exposure control, adjustment of the mA and/or kV according to patient size and/or use of iterative reconstruction technique. CONTRAST:  75mL OMNIPAQUE IOHEXOL 350 MG/ML SOLN COMPARISON:  Head CT 02-04-2023. FINDINGS: CTA NECK FINDINGS Significant motion artifact through the carotid bulbs, proximal cervical ICAs, and mid to distal V2 segments of the vertebral arteries. Aortic arch: Incompletely imaged. Two-vessel arch configuration with common origin of the right brachiocephalic and left common carotid arteries. Visualized arch vessel origins are patent. Right carotid system: Within limits of patient motion artifact, no evidence of dissection, stenosis (50% or greater), or occlusion. Left carotid system: Within limits of patient motion artifact, no evidence of dissection, stenosis (50% or greater), or occlusion. Vertebral arteries: Within limits of patient motion artifact, no evidence of dissection, stenosis (50% or greater), or occlusion. Skeleton: Within limits of patient motion artifact, no suspicious bone lesion. Other neck: Nasopharyngeal and laryngeal mask airways in place. Upper chest: Unremarkable. Review of the MIP images confirms the above findings CTA HEAD FINDINGS Anterior circulation: Intracranial ICAs are patent without stenosis or aneurysm. The proximal ACAs and MCAs are patent without stenosis or aneurysm. Distal branches are  symmetric. Posterior circulation: Normal basilar artery. The SCAs, AICAs and PICAs are patent proximally. The PCAs are patent proximally without stenosis or aneurysm. Distal branches are symmetric. Venous sinuses: Early phase of contrast. Anatomic variants: None. Review of the MIP images confirms the above findings IMPRESSION: 1. Significant motion artifact through the carotid bulbs, proximal cervical ICAs, and mid to distal V2 segments of the vertebral arteries. Within limits of patient motion artifact, no evidence of hemodynamically significant stenosis in the neck. 2. No intracranial large vessel occlusion or significant stenosis. Electronically Signed   By: Orvan Falconer M.D.   On: 02-04-23 09:35   CT HEAD CODE STROKE WO CONTRAST  Result Date: 02/04/23 CLINICAL DATA:  Code stroke. Neuro deficit, acute, stroke suspected. Right-sided gaze. EXAM: CT HEAD WITHOUT CONTRAST TECHNIQUE: Contiguous axial images were obtained from the base of the skull through the vertex without intravenous contrast. RADIATION DOSE REDUCTION: This exam was performed according to the departmental dose-optimization program which includes automated exposure control, adjustment of the mA and/or kV according to patient size and/or use of iterative reconstruction technique. COMPARISON:  None Available. FINDINGS: Brain: No acute intracranial hemorrhage. Gray-white differentiation is preserved. No hydrocephalus or extra-axial collection. No mass effect or midline shift. Vascular: No hyperdense vessel or unexpected calcification. Skull: No calvarial fracture or suspicious bone lesion. Skull base is unremarkable. Sinuses/Orbits: Unremarkable. Other: None. ASPECTS (Alberta Stroke Program Early CT Score) - Ganglionic level infarction (caudate, lentiform nuclei, internal capsule, insula, M1-M3 cortex): 7 - Supraganglionic infarction (M4-M6 cortex): 3 Total score (0-10 with 10 being normal): 10 IMPRESSION: No acute intracranial hemorrhage or  evidence of acute large vessel territory infarct. ASPECT score is 10. Code stroke imaging results were communicated on 2023-02-04 at 9:07 am to provider Dr. Iver Nestle via secure text paging. Electronically Signed   By: Elwyn Reach.D.  On: 01/20/2023 09:07    Microbiology No results found for this or any previous visit (from the past 240 hour(s)).  Lab Basic Metabolic Panel: Recent Labs  Lab 01/06/2023 0837 12/22/2022 0846 12/24/2022 1003  NA 131* 132* 130*  K 3.6 3.6 4.7  CL 102 104  --   CO2 15*  --   --   GLUCOSE 223* 212*  --   BUN 9 9  --   CREATININE 1.29* 1.10  --   CALCIUM 8.9  --   --    Liver Function Tests: Recent Labs  Lab 01/14/2023 0837  AST 23  ALT 12  ALKPHOS 54  BILITOT 0.8  PROT 5.6*  ALBUMIN 3.3*   No results for input(s): "LIPASE", "AMYLASE" in the last 168 hours. No results for input(s): "AMMONIA" in the last 168 hours. CBC: Recent Labs  Lab 01/07/2023 0837 01/10/2023 0846 01/05/2023 1003  WBC 6.6  --   --   NEUTROABS 2.2  --   --   HGB 10.8* 10.9* 8.2*  HCT 35.8* 32.0* 24.0*  MCV 99.7  --   --   PLT 321  --   --    Cardiac Enzymes: No results for input(s): "CKTOTAL", "CKMB", "CKMBINDEX", "TROPONINI" in the last 168 hours. Sepsis Labs: Recent Labs  Lab 01/14/2023 0837  WBC 6.6    Leslye Peer 01/06/2023, 4:13 PM

## 2023-01-21 NOTE — Procedures (Signed)
Cardiopulmonary Resuscitation Note  Billy Thompson  [id number]  January 29, 1956  Date:12/26/2022  Time:11:16 AM   Provider Performing:Cleland Simkins S Kaytlin Burklow   Procedure: Cardiopulmonary Resuscitation (92950)  Indication(s) Loss of Pulse >> PEA  Consent N/A  Anesthesia N/A   Time Out N/A   Sterile Technique Hand hygiene, gloves   Procedure Description Called to patient's room for CODE BLUE. Initial rhythm was PEA/Asystole. Patient received high quality chest compressions for over 30 minutes with defibrillation or cardioversion when appropriate. Epinephrine was administered every 3 minutes as directed by time Biomedical engineer. Additional pharmacologic interventions included sodium bicarbonate. Additional procedural interventions include intubation and bedside FAST exam.  Return of spontaneous circulation was not achieved.  Family at bedside.   Complications/Tolerance N/A   EBL N/A   Specimen(s) N/A  Estimated time to ROSC: NA   Levy Pupa, MD, PhD 01/01/2023, 11:19 AM [facility] [phone] or if no answer before 7:00PM call [phone] For any issues after 7:00PM please call eLink [phone]

## 2023-01-21 NOTE — Progress Notes (Signed)
RT called to room for possible code stroke. Pt came in being bagged with an LMA in placed. Stat CT was ordered and MD wanted to leave LMA in place and place Pt on the ventilator for the CT scan. RT placed Pt on vent and took pt to CT. Based on CT results MD order removal of LMA. Pt currently stable at this time. RT will continue to monitor as needed.

## 2023-01-21 NOTE — Progress Notes (Signed)
CCM was notified of critical ABG values.

## 2023-01-21 NOTE — ED Triage Notes (Addendum)
Pt BIB [facility] from home as a code stroke. Per EMS he was last seen by wife getting out of the shower around 8AM. Around 8:10 AM she heard a thunk and found him on the ground not responding to her. Pt was intubated by EMS.

## 2023-01-21 NOTE — Progress Notes (Signed)
   12/25/2022 1100  Spiritual Encounters  Type of Visit Initial  Care provided to: Family  Referral source Nurse (RN/NT/LPN)  Reason for visit Patient death  OnCall Visit No   Chaplain responded to a call for chaplain support for family member. The patient, Billy Thompson, died and family member was attempting to juggle several different situations that she was now facing.  The chaplain provided a listening ear as she shared what happen to [place] and the devastation of his death. The family was looking forward to gathering for holiday next week. The family member was not sure which funeral home she intended to use. I provided a placement card for when she decides. She shared that she knows she will need to process today's events but that right now she needs to care for her children though grown this is a huge shock. She needed to call a daughter that was en route so I departed to allow her some space. I advised her that if she wished to speak with a chaplain later in the day to reach out to her nurse. Someone would respond.   Valerie Roys [facility]  [phone]

## 2023-01-21 NOTE — Consult Note (Signed)
Neurology Consultation Reason for Consult: Code stroke Requesting Physician:   CC: none  History is obtained from:EMS and wife  HPI: Billy Thompson is a 67 y.o. male with history of hypertension and hyperlipidemia who presents after falling at home this morning and having rhythmic movements of bilateral lower extremities.  Patient's wife stated that at 8:00, he was totally normal and that at about 810, she heard a thump and found him on the floor of the bedroom with rhythmic movements of bilateral legs.  Patient was able to say he was fine at that time but then stopped talking.  His eyes then rolled up into the back of his head and he had a contraction movement of his right arm which appeared abnormal to his wife.  Patient does not have a personal history of seizures, but his daughter does have seizures.  He has never had a serious head injury, although he may have had a minor head injury when playing sports as a teenager.  He has no history of meningitis or encephalitis and no problems with his birth or development.  He drinks alcohol, about a beer every day (less recently due to his knee surgery) but uses no other drugs.  Patient has not been sleeping well since his knee replacement on May 5 and has had very little sleep over the past few nights due to his daughter's care needs (she has Aicardi syndrome, requiring significant care but has had fortunately an excellent course given the underlying diagnosis).    LKW: 0800 Thrombolytic given?: No, symptoms thought not to be due to stroke IA performed?: No, no LVO Premorbid modified rankin scale: 1  ROS: Unable to obtain due to altered mental status.   Past Medical History:  Diagnosis Date   Hypercholesteremia    Hypertension    No family history on file.  Social History:  reports that he has never smoked. He has never used smokeless tobacco. He reports current alcohol use. He reports that he does not use drugs.  Exam: Current vital  signs: Current vital signs: BP (!) 126/57   Pulse (!) 42   Temp (!) 96.9 F (36.1 C) (Temporal)   Resp (!) 34   Ht 6' (1.829 m)   Wt 90 kg   SpO2 (!) 88%   BMI 26.91 kg/m  Vital signs in last 24 hours: Temp:  [96.9 F (36.1 C)] 96.9 F (36.1 C) (06/21 0925) Pulse Rate:  [42-52] 42 (06/21 0848) Resp:  [23-34] 34 (06/21 0900) BP: (126-144)/(49-57) 126/57 (06/21 0900) SpO2:  [84 %-88 %] 88 % (06/21 0848) FiO2 (%):  [100 %] 100 % (06/21 0900) Weight:  [90 kg] 90 kg (06/21 1108)       Exam on arrival  Ill appearing, oral airway, being bagged (no gag for EMS on airway insertion), no withdrawal in all 4, R pupil 1 mm > L but both reactive, intact corneals to eyelash brush, tachycardic to 130s with regular rhythm  Slowly began to have movements of the RUE. Followed commands (2 fingers, thumbs up) in BLE, wiggled toes on the LLE but not the right initially   Subsequently wiggled RLE toes as well   He had an LMA in place on arrival to the ED, but this was removed after CT scan.  Patient was oriented to person and place but not time and situation afterwards and became very restless.  He then had another seizure episode where he had left gaze deviation and right arm twitching.  2 mg of lorazepam was given, and patient was loaded with Keppra.  Subsequently he remained postictally confused, moving all 4 extremities slightly more brisk on the left than the right, not following commands.  While CCM was evaluating the patient and he had witnessed arrest, please see their documentation for details   NIHSS total   1a Level of Conscious.: 2 1b LOC Questions: 2 1c LOC Commands: 0 2 Best Gaze: 0 3 Visual: 0 4 Facial Palsy: 0 5a Motor Arm - left: 3 5b Motor Arm - Right:3  6a Motor Leg - Left: 3 6b Motor Leg - Right: 3 7 Limb Ataxia: 0 8 Sensory: 0 9 Best Language: 3T 10 Dysarthria: 2 11 Extinct and Inattention.: 0 TOTAL: 21    Performed at 8:35 AM   I have reviewed labs in  epic and the results pertinent to this consultation are:  Basic Metabolic Panel: Recent Labs  Lab 01-29-23 0837 Jan 29, 2023 0846 01-29-2023 1003  NA 131* 132* 130*  K 3.6 3.6 4.7  CL 102 104  --   CO2 15*  --   --   GLUCOSE 223* 212*  --   BUN 9 9  --   CREATININE 1.29* 1.10  --   CALCIUM 8.9  --   --     CBC: Recent Labs  Lab Jan 29, 2023 0837 January 29, 2023 0846 01/29/23 1003  WBC 6.6  --   --   NEUTROABS 2.2  --   --   HGB 10.8* 10.9* 8.2*  HCT 35.8* 32.0* 24.0*  MCV 99.7  --   --   PLT 321  --   --     Coagulation Studies: Recent Labs    January 29, 2023 0837  LABPROT 14.1  INR 1.1      I have reviewed the images obtained:  CT head: No acute abnormality  CTA head and neck: No LVO or hemodynamically significant stenosis, but nondiagnostic in the neck due to patient movement  Impression/Plan:   Initial impression was new onset seizure provoked seizures in the setting of severe sleep deprivation; history and exam seemed consistent with recovery from postictal state with seizure focus on the left  Initial plan had been to obtain long-term EEG monitoring and MRI brain with and without contrast to assess underlying etiology, especially given suspicion for focal onset.  Unfortunately, patient did not recover from cardiac arrest, unclear etiology of arrest at this time  Greatly appreciate assistance of CCM team   Brooke Dare MD-PhD [facility] [phone] Available 7 AM to 7 PM, outside these hours please contact Neurologist on call listed on AMION   Total critical care time: 45 minutes   Critical care time was exclusive of separately billable procedures and treating other patients.   Critical care was necessary to treat or prevent imminent or life-threatening deterioration, but unfortunately patient passed away despite our best efforts.   Critical care was time spent personally by me on the following activities: development of treatment plan with patient  and/or surrogate as well as nursing, discussions with consultants/primary team, evaluation of patient's response to treatment, examination of patient, obtaining history from patient or surrogate, ordering and performing treatments and interventions, ordering and review of laboratory studies, ordering and review of radiographic studies, and re-evaluation of patient's condition as needed, as documented above.

## 2023-01-21 NOTE — H&P (Signed)
NAME:  Billy Thompson, MRN:  [id number], DOB:  1956-06-25, LOS: 0 ADMISSION DATE:  02-07-2023, CONSULTATION DATE: 02-07-23 REFERRING MD: Emergency department physician, CHIEF COMPLAINT: Altered mental status and subsequent PEA arrest  History of Present Illness:  67 year old male who was at home in his shower and his wife heard him fall he was noted to have altered mental status in the LMA was placed by EMS and transported to [facility].  He had a negative CT of the head he had had an LMA placed it was removed.  He did not follow commands had intermittent movement of extremities.  He was agitated at times.  Prior to his transfer to the intensive care unit he suffered a PEA a arrest and was not successfully resuscitated.  Pertinent  Medical History   Past Medical History:  Diagnosis Date   Hypercholesteremia    Hypertension      Significant Hospital Events: Including procedures, antibiotic start and stop dates in addition to other pertinent events   2023/02/07 PEA arrest  Interim History / Subjective:  Status post PEA arrest with subsequent death  Objective   Blood pressure (!) 126/57, pulse (!) 42, temperature (!) 96.9 F (36.1 C), temperature source Temporal, resp. rate (!) 34, SpO2 (!) 88 %.    Vent Mode: PRVC FiO2 (%):  [100 %] 100 % Set Rate:  [15 bmp] 15 bmp Vt Set:  [500 mL] 500 mL PEEP:  [5 cmH20] 5 cmH20  No intake or output data in the 24 hours ending February 07, 2023 1000 There were no vitals filed for this visit.  Examination: General: Decreased level of consciousness HENT: No JVD Lungs: Decreased breath sounds Cardiovascular: Heart sounds were regular Abdomen: Soft nontender Extremities: Without edema Neuro: Prior to code did not follow commands noted [place] PEA arrest prior to transfer to the floor and was not successfully resuscitated Resolved Hospital Problem list     Assessment & Plan:  Altered mental status with fall but with negative CT of the head and  noted to have a seizure. Evaluated by neurology Was to have a 24-hour EEG Started on Keppra Unfortunately prior to being transferred to the ICU he developed a PEA arrest and was not resuscitated successfully.  He was intubated had multiple rounds of epinephrine bicarbonate and an endotracheal tube was placed.  No shockable rhythm.  Code was called by Dr. Delton Coombes.  Wife was updated by Dr. Delton Coombes at the bedside  Best Practice (right click and "Reselect all SmartList Selections" daily)   Diet/type: NPO DVT prophylaxis: not indicated GI prophylaxis: PPI Lines: N/A Foley:  N/A Code Status:  full code Last date of multidisciplinary goals of care discussion [tbd]  Labs   CBC: Recent Labs  Lab 02/07/2023 0837 02-07-2023 0846  WBC 6.6  --   NEUTROABS 2.2  --   HGB 10.8* 10.9*  HCT 35.8* 32.0*  MCV 99.7  --   PLT 321  --     Basic Metabolic Panel: Recent Labs  Lab 2023/02/07 0837 02/07/23 0846  NA 131* 132*  K 3.6 3.6  CL 102 104  CO2 15*  --   GLUCOSE 223* 212*  BUN 9 9  CREATININE 1.29* 1.10  CALCIUM 8.9  --    GFR: CrCl cannot be calculated (Unknown ideal weight.). Recent Labs  Lab 02-07-23 0837  WBC 6.6    Liver Function Tests: Recent Labs  Lab 02/07/23 0837  AST 23  ALT 12  ALKPHOS 54  BILITOT 0.8  PROT  5.6*  ALBUMIN 3.3*   No results for input(s): "LIPASE", "AMYLASE" in the last 168 hours. No results for input(s): "AMMONIA" in the last 168 hours.  ABG    Component Value Date/Time   TCO2 16 (L) 12/26/2022 0846     Coagulation Profile: Recent Labs  Lab 12/28/2022 0837  INR 1.1    Cardiac Enzymes: No results for input(s): "CKTOTAL", "CKMB", "CKMBINDEX", "TROPONINI" in the last 168 hours.  HbA1C: No results found for: "HGBA1C"  CBG: Recent Labs  Lab 12/26/2022 0839  GLUCAP 171*    Review of Systems:   na  Past Medical History:  He,  has a past medical history of Hypercholesteremia and Hypertension.   Surgical History:   Past Surgical  History:  Procedure Laterality Date   HIP SURGERY Right    replacement   HIP SURGERY Left    replacement     Social History:   reports that he has never smoked. He has never used smokeless tobacco. He reports current alcohol use. He reports that he does not use drugs.   Family History:  His family history is not on file.   Allergies No Known Allergies   Home Medications  Prior to Admission medications   Medication Sig Start Date End Date Taking? Authorizing Provider  amoxicillin-clavulanate (AUGMENTIN) 875-125 MG per tablet Take 1 tablet by mouth every 12 (twelve) hours. Patient not taking: Reported on 07/04/2017 06/12/12   Bonk, Orson Aloe, MD  ASPIRIN LOW DOSE 81 MG tablet PLEASE SEE ATTACHED FOR DETAILED DIRECTIONS 11/22/22   [provider]  COVID-19 mRNA vaccine, Moderna, 100 MCG/0.5ML injection Inject into the muscle. 12/15/20   Judyann Munson, MD  gabapentin (NEURONTIN) 300 MG capsule Take by mouth. 11/22/22   [provider]  hydrochlorothiazide (HYDRODIURIL) 25 MG tablet Take 25 mg by mouth daily.    [provider]  lisinopril (PRINIVIL,ZESTRIL) 20 MG tablet Take 20 mg by mouth daily.    [provider]  methocarbamol (ROBAXIN) 500 MG tablet Take 500 mg by mouth every 6 (six) hours as needed for muscle spasms. 11/22/22   [provider]  ondansetron (ZOFRAN) 4 MG tablet Take 4 mg by mouth every 6 (six) hours as needed for nausea or vomiting. 11/22/22   [provider]  oxyCODONE (OXY IR/ROXICODONE) 5 MG immediate release tablet Take 5-10 mg by mouth every 6 (six) hours as needed for moderate pain or severe pain. 11/22/22   [provider]  sildenafil (VIAGRA) 100 MG tablet  04/03/17   [provider]  simvastatin (ZOCOR) 20 MG tablet Take 20 mg by mouth every evening.    [provider]  simvastatin (ZOCOR) 40 MG tablet Take 40 mg by mouth daily at 6 PM.    [provider]  traMADol (ULTRAM) 50 MG  tablet Take 50-100 mg by mouth every 6 (six) hours as needed for moderate pain or severe pain. 12/18/22   [provider]     Critical care time: 45 min   Brett Canales Girlie Veltri ACNP Acute Care Nurse Practitioner [facility]/Critical Care Please consult Amion 01/15/2023, 10:00 AM

## 2023-01-21 NOTE — ED Provider Notes (Addendum)
[place] EMERGENCY DEPARTMENT AT [facility] Provider Note   CSN: [id number] Arrival date & time: 02/02/2023  5784  An emergency department physician performed an initial assessment on this suspected stroke patient at 67.  History  Chief Complaint  Patient presents with   Code Stroke    Billy Thompson is a 67 y.o. male.  Patient is a 67 year old male with a PMH of HTN, HLD presenting to the emergency department as a code stroke. Per EMS he was last seen by wife getting out of the shower around 8AM. Around 8:10 AM she heard a thunk and found him on the ground not responding to her. He had some rhythmic shaking of the legs. When EMS arrived he appeared to have R gaze preference with agonal respirations. He was not withdrawing in all 4 extremities or following commands. LMA was placed for airway protection and he was bagged. He was tachycardic but otherwise hemodynamically stable with BP in 140s in route.   The history is provided by a relative and the EMS personnel. The history is limited by the condition of the patient (Level 5 caveat for AMS).       Home Medications Prior to Admission medications   Medication Sig Start Date End Date Taking? Authorizing Provider  amoxicillin-clavulanate (AUGMENTIN) 875-125 MG per tablet Take 1 tablet by mouth every 12 (twelve) hours. Patient not taking: Reported on 07/04/2017 06/12/12   Bonk, Orson Aloe, MD  ASPIRIN LOW DOSE 81 MG tablet PLEASE SEE ATTACHED FOR DETAILED DIRECTIONS 11/22/22   [provider]  COVID-19 mRNA vaccine, Moderna, 100 MCG/0.5ML injection Inject into the muscle. 12/15/20   Judyann Munson, MD  gabapentin (NEURONTIN) 300 MG capsule Take by mouth. 11/22/22   [provider]  hydrochlorothiazide (HYDRODIURIL) 25 MG tablet Take 25 mg by mouth daily.    [provider]  lisinopril (PRINIVIL,ZESTRIL) 20 MG tablet Take 20 mg by mouth daily.    [provider]  methocarbamol (ROBAXIN) 500  MG tablet Take 500 mg by mouth every 6 (six) hours as needed for muscle spasms. 11/22/22   [provider]  ondansetron (ZOFRAN) 4 MG tablet Take 4 mg by mouth every 6 (six) hours as needed for nausea or vomiting. 11/22/22   [provider]  oxyCODONE (OXY IR/ROXICODONE) 5 MG immediate release tablet Take 5-10 mg by mouth every 6 (six) hours as needed for moderate pain or severe pain. 11/22/22   [provider]  sildenafil (VIAGRA) 100 MG tablet  04/03/17   [provider]  simvastatin (ZOCOR) 20 MG tablet Take 20 mg by mouth every evening.    [provider]  simvastatin (ZOCOR) 40 MG tablet Take 40 mg by mouth daily at 6 PM.    [provider]  traMADol (ULTRAM) 50 MG tablet Take 50-100 mg by mouth every 6 (six) hours as needed for moderate pain or severe pain. 12/18/22   [provider]      Allergies    Patient has no known allergies.    Review of Systems   Review of Systems  Physical Exam Updated Vital Signs BP (!) 126/57   Pulse (!) 42   Temp (!) 96.9 F (36.1 C) (Temporal)   Resp (!) 34   SpO2 (!) 88%  Physical Exam Vitals and nursing note reviewed.  Constitutional:      Comments: Eyes open, spontaneously breathing with LMA in place, otherwise unresponsive.  HENT:     Head: Normocephalic and atraumatic.  Nose: Nose normal.     Mouth/Throat:     Mouth: Mucous membranes are moist.  Eyes:     Comments: L pupil ~91mm smaller than R pupil, both reactive, roving eyes side to side  Cardiovascular:     Rate and Rhythm: Regular rhythm. Tachycardia present.     Heart sounds: Normal heart sounds.  Pulmonary:     Effort: Pulmonary effort is normal.     Breath sounds: Normal breath sounds.  Abdominal:     General: Abdomen is flat.     Palpations: Abdomen is soft.     Tenderness: There is no abdominal tenderness.  Musculoskeletal:        General: No tenderness or deformity.     Cervical back: Neck supple.  Skin:     General: Skin is warm and dry.  Neurological:     Comments: GCS 4 (eyes open)     ED Results / Procedures / Treatments   Labs (all labs ordered are listed, but only abnormal results are displayed) Labs Reviewed  APTT - Abnormal; Notable for the following components:      Result Value   aPTT 23 (*)    All other components within normal limits  CBC - Abnormal; Notable for the following components:   RBC 3.59 (*)    Hemoglobin 10.8 (*)    HCT 35.8 (*)    All other components within normal limits  COMPREHENSIVE METABOLIC PANEL - Abnormal; Notable for the following components:   Sodium 131 (*)    CO2 15 (*)    Glucose, Bld 223 (*)    Creatinine, Ser 1.29 (*)    Total Protein 5.6 (*)    Albumin 3.3 (*)    All other components within normal limits  I-STAT CHEM 8, ED - Abnormal; Notable for the following components:   Sodium 132 (*)    Glucose, Bld 212 (*)    Calcium, Ion 1.13 (*)    TCO2 16 (*)    Hemoglobin 10.9 (*)    HCT 32.0 (*)    All other components within normal limits  CBG MONITORING, ED - Abnormal; Notable for the following components:   Glucose-Capillary 171 (*)    All other components within normal limits  I-STAT ARTERIAL BLOOD GAS, ED - Abnormal; Notable for the following components:   pH, Arterial 7.022 (*)    pCO2 arterial 30.1 (*)    pO2, Arterial 410 (*)    Bicarbonate 7.9 (*)    TCO2 9 (*)    Acid-base deficit 22.0 (*)    Sodium 130 (*)    HCT 24.0 (*)    Hemoglobin 8.2 (*)    All other components within normal limits  CULTURE, BLOOD (ROUTINE X 2)  CULTURE, BLOOD (ROUTINE X 2)  ETHANOL  PROTIME-INR  DIFFERENTIAL  RAPID URINE DRUG SCREEN, HOSP PERFORMED  BLOOD GAS, ARTERIAL  HIV ANTIBODY (ROUTINE TESTING W REFLEX)  LACTIC ACID, PLASMA  LACTIC ACID, PLASMA  PROCALCITONIN  URINALYSIS, W/ REFLEX TO CULTURE (INFECTION SUSPECTED)  BLOOD GAS, ARTERIAL    EKG EKG Interpretation  Date/Time:  Friday 02/04/2023 08:41:48 EDT Ventricular Rate:   133 PR Interval:  144 QRS Duration: 99 QT Interval:  314 QTC Calculation: 467 R Axis:   72 Text Interpretation: Sinus tachycardia Borderline ST depression, diffuse leads likely rate related Confirmed by Elayne Snare ([id number]) on 04-Feb-2023 9:01:59 AM  Radiology CT C-SPINE NO CHARGE  Result Date: February 04, 2023 CLINICAL DATA:  Multiple falls at home. EXAM:  CT CERVICAL SPINE WITHOUT CONTRAST TECHNIQUE: Multidetector CT imaging of the cervical spine was performed without intravenous contrast. Multiplanar CT image reconstructions were also generated. RADIATION DOSE REDUCTION: This exam was performed according to the departmental dose-optimization program which includes automated exposure control, adjustment of the mA and/or kV according to patient size and/or use of iterative reconstruction technique. COMPARISON:  None Available. FINDINGS: Alignment: Within limits of patient motion artifact, no evidence of traumatic malalignment. Skull base and vertebrae: Within limits of patient motion artifact, no acute fracture. Soft tissues and spinal canal: Within limits of patient motion artifact, no prevertebral edema. Disc levels: Multilevel cervical spondylosis, worst at C5-6, where there is at least mild spinal canal stenosis. Upper chest: Unremarkable. Other: None. IMPRESSION: 1. Within limits of patient motion artifact, no acute cervical spine fracture or traumatic malalignment. 2. Multilevel cervical spondylosis, worst at C5-6 where there is at least mild spinal canal stenosis. Electronically Signed   By: Orvan Falconer M.D.   On: January 30, 2023 09:37   CT ANGIO HEAD NECK W WO CM (CODE STROKE)  Result Date: 01-30-2023 CLINICAL DATA:  Neuro deficit, acute, stroke suspected. EXAM: CT ANGIOGRAPHY HEAD AND NECK WITH AND WITHOUT CONTRAST TECHNIQUE: Multidetector CT imaging of the head and neck was performed using the standard protocol during bolus administration of intravenous contrast. Multiplanar CT image  reconstructions and MIPs were obtained to evaluate the vascular anatomy. Carotid stenosis measurements (when applicable) are obtained utilizing NASCET criteria, using the distal internal carotid diameter as the denominator. RADIATION DOSE REDUCTION: This exam was performed according to the departmental dose-optimization program which includes automated exposure control, adjustment of the mA and/or kV according to patient size and/or use of iterative reconstruction technique. CONTRAST:  75mL OMNIPAQUE IOHEXOL 350 MG/ML SOLN COMPARISON:  Head CT Jan 30, 2023. FINDINGS: CTA NECK FINDINGS Significant motion artifact through the carotid bulbs, proximal cervical ICAs, and mid to distal V2 segments of the vertebral arteries. Aortic arch: Incompletely imaged. Two-vessel arch configuration with common origin of the right brachiocephalic and left common carotid arteries. Visualized arch vessel origins are patent. Right carotid system: Within limits of patient motion artifact, no evidence of dissection, stenosis (50% or greater), or occlusion. Left carotid system: Within limits of patient motion artifact, no evidence of dissection, stenosis (50% or greater), or occlusion. Vertebral arteries: Within limits of patient motion artifact, no evidence of dissection, stenosis (50% or greater), or occlusion. Skeleton: Within limits of patient motion artifact, no suspicious bone lesion. Other neck: Nasopharyngeal and laryngeal mask airways in place. Upper chest: Unremarkable. Review of the MIP images confirms the above findings CTA HEAD FINDINGS Anterior circulation: Intracranial ICAs are patent without stenosis or aneurysm. The proximal ACAs and MCAs are patent without stenosis or aneurysm. Distal branches are symmetric. Posterior circulation: Normal basilar artery. The SCAs, AICAs and PICAs are patent proximally. The PCAs are patent proximally without stenosis or aneurysm. Distal branches are symmetric. Venous sinuses: Early phase of  contrast. Anatomic variants: None. Review of the MIP images confirms the above findings IMPRESSION: 1. Significant motion artifact through the carotid bulbs, proximal cervical ICAs, and mid to distal V2 segments of the vertebral arteries. Within limits of patient motion artifact, no evidence of hemodynamically significant stenosis in the neck. 2. No intracranial large vessel occlusion or significant stenosis. Electronically Signed   By: Orvan Falconer M.D.   On: January 30, 2023 09:35   CT HEAD CODE STROKE WO CONTRAST  Result Date: 01/30/2023 CLINICAL DATA:  Code stroke. Neuro deficit, acute, stroke suspected. Right-sided gaze. EXAM:  CT HEAD WITHOUT CONTRAST TECHNIQUE: Contiguous axial images were obtained from the base of the skull through the vertex without intravenous contrast. RADIATION DOSE REDUCTION: This exam was performed according to the departmental dose-optimization program which includes automated exposure control, adjustment of the mA and/or kV according to patient size and/or use of iterative reconstruction technique. COMPARISON:  None Available. FINDINGS: Brain: No acute intracranial hemorrhage. Gray-white differentiation is preserved. No hydrocephalus or extra-axial collection. No mass effect or midline shift. Vascular: No hyperdense vessel or unexpected calcification. Skull: No calvarial fracture or suspicious bone lesion. Skull base is unremarkable. Sinuses/Orbits: Unremarkable. Other: None. ASPECTS (Alberta Stroke Program Early CT Score) - Ganglionic level infarction (caudate, lentiform nuclei, internal capsule, insula, M1-M3 cortex): 7 - Supraganglionic infarction (M4-M6 cortex): 3 Total score (0-10 with 10 being normal): 10 IMPRESSION: No acute intracranial hemorrhage or evidence of acute large vessel territory infarct. ASPECT score is 10. Code stroke imaging results were communicated on 12/27/2022 at 9:07 am to provider Dr. Iver Nestle via secure text paging. Electronically Signed   By: Orvan Falconer M.D.   On: 01/15/2023 09:07    Procedures .Critical Care  Performed by: Rexford Maus, DO Authorized by: Rexford Maus, DO   Critical care provider statement:    Critical care time (minutes):  80   Critical care time was exclusive of:  Separately billable procedures and treating other patients   Critical care was necessary to treat or prevent imminent or life-threatening deterioration of the following conditions:  CNS failure or compromise   Critical care was time spent personally by me on the following activities:  Development of treatment plan with patient or surrogate, discussions with consultants, discussions with primary provider, evaluation of patient's response to treatment, examination of patient, obtaining history from patient or surrogate, ordering and performing treatments and interventions, ordering and review of laboratory studies, ordering and review of radiographic studies, pulse oximetry, re-evaluation of patient's condition and review of old charts   I assumed direction of critical care for this patient from another provider in my specialty: no     Care discussed with: admitting provider       Medications Ordered in ED Medications  docusate sodium (COLACE) capsule 100 mg (has no administration in time range)  polyethylene glycol (MIRALAX / GLYCOLAX) packet 17 g (has no administration in time range)  0.9 %  sodium chloride infusion (has no administration in time range)  sodium bicarbonate injection 50 mEq (has no administration in time range)  levETIRAcetam (KEPPRA) IVPB 1000 mg/100 mL premix (has no administration in time range)  iohexol (OMNIPAQUE) 350 MG/ML injection 75 mL (75 mLs Intravenous Contrast Given 01/03/2023 0917)  LORazepam (ATIVAN) injection 2 mg (2 mg Intravenous Given 12/31/2022 0923)  lactated ringers bolus 1,000 mL (1,000 mLs Intravenous New Bag/Given 01/08/2023 0927)  levETIRAcetam (KEPPRA) IVPB 1500 mg/ 100 mL premix (0 mg Intravenous  Stopped 12/23/2022 0956)    Followed by  levETIRAcetam (KEPPRA) IVPB 1500 mg/ 100 mL premix (0 mg Intravenous Stopped 12/22/2022 0956)    Followed by  levETIRAcetam (KEPPRA) IVPB 1000 mg/100 mL premix (0 mg Intravenous Stopped 01/07/2023 0956)    Followed by  levETIRAcetam (KEPPRA) IVPB 500 mg/100 mL premix (0 mg Intravenous Stopped 12/23/2022 0956)    ED Course/ Medical Decision Making/ A&P Clinical Course as of 01/10/2023 1057  Fri Jan 11, 2023  0929 After the patient's etomidate wore off at the end of CT scan and he is beginning to wake up and become agitated, pulling  at his lines and tubes.  He is satting well and was extubated upon his return to the room to nasal cannula satting between 96 to 100%.  The patient could tell us his name but was confused to location and date.  He was moving all 4 extremities side-to-side and was agitated trying to get up out of bed.  Shortly after, the patient stiffened and had left gaze deviation with twitching of the right arm concerning for subsequent seizure.  He was given 2 mg of IV Ativan and loaded with 4.5 g of Keppra with concern for status with his seizure without return to baseline.  Neurology recommended LTM and admission to ICU for further management. [VK]  1053 Patient was accepted to the ICU for admission.  While being evaluated by critical care NP he was noted to be bradying down and began having agonal breaths aberrations.  Patient was immediately started to be bagged and CODE BLUE was called.  The patient was found to be pulseless and CPR was initiated.  Both critical care Dr. Delton Coombes and myself were at bedside.  Patient was intubated by ICU.  The patient received 5 rounds of epinephrine, 2 A of bicarb and was started on a Levophed drip.  He had minimal cardiac activity on bedside ultrasound, no visible pericardial effusions, equal breath sounds bilaterally.  No reversible cause of cardiac arrest was identified.  After approximately 25 minutes of CPR we are unable to  obtain ROSC and further efforts were deemed futile.  Time of death was called at 1049. Family updated at bedside by critical care. [VK]    Clinical Course User Index [VK] Rexford Maus, DO                             Medical Decision Making This patient presents to the ED with chief complaint(s) of AMS, code stroke with pertinent past medical history of HTN, HLD which further complicates the presenting complaint. The complaint involves an extensive differential diagnosis and also carries with it a high risk of complications and morbidity.    The differential diagnosis includes CVA, ICH/mass effect, seizure, electrolyte abnormality, infection, metabolic encephalopathy  Additional history obtained: Additional history obtained from EMS  Records reviewed Care Everywhere/External Records  ED Course and Reassessment: On patient's arrival to the emergency department he had an LMA in place and is being bagged by EMS.  Initial oxygen saturation is in the 90s.  He is tachycardic and mildly tachypneic.  He did have a roving eye ocular movement side-to-side but otherwise is not responding to noxious stimuli in all 4 extremities.  IV access was obtained, glucose on arrival was within normal range.  We are initially preparing to intubate and the patient slowly started to have increasing responsiveness, initially with movement of his right hand and then started to follow commands.  He was following commands in bilateral arms and in the left lower extremity, no movement yet from right lower extremity.  Plan was to leave LMA in place until after CT to reevaluate mental status prior to extubation or definitive intubation.  The patient was given etomidate prior to CT for sedation.  He had no evidence of any traumatic injuries on exam.  Independent labs interpretation:  The following labs were independently interpreted: low bicarb and mild hyperglycemia likely in the setting of seizure  Independent  visualization of imaging: - I independently visualized the following imaging with scope of interpretation limited  to determining acute life threatening conditions related to emergency care: CTH, which revealed no acute disease  Consultation: - Consulted or discussed management/test interpretation w/ external professional: neurology, critical care  Consideration for admission or further workup: patient requires admission for further work up of new onset seizure Social Determinants of health: N/A    Amount and/or Complexity of Data Reviewed Labs: ordered. Radiology: ordered.  Risk Prescription drug management. Decision regarding hospitalization.        Final Clinical Impression(s) / ED Diagnoses Final diagnoses:  New onset seizure [facility])    Rx / DC Orders ED Discharge Orders     None         Rexford Maus, DO 01-Feb-2023 0958    Rexford Maus, DO February 01, 2023 1057

## 2023-01-21 NOTE — Code Documentation (Signed)
Stroke Response Nurse Documentation Code Documentation  Billy Thompson is a 67 y.o. male arriving to [facility]  via [facility] on 01/10/2023 with past medical hx of hypertension and hypercholesteremia. On No antithrombotic.   Patient from home where he was LKW at 0800 in the shower. Shortly after, his wife heard a thump in the bedroom. Patient said he was fine then his eyes rolled back. Code stroke was activated by EMS. Oral airway placed by EMS and patient being bagged upon arrival.   Stroke team at the bedside on patient arrival. Labs drawn and patient taken to room to ensure stable for CT by Dr. Theresia Lo. Billy Ivan, NP spoke with patient's wife on phone and reported patient had rhythmic movements of bilateral legs. Patient to CT with team. Initial NIHSS 35, see documentation for details and code stroke times. Patient with decreased LOC, disoriented, not following commands, left gaze preference , right hemianopia, bilateral arm weakness, bilateral leg weakness, bilateral decreased sensation, Global aphasia , dysarthria , and Sensory  neglect on exam. Began waking up with improved NIHSS of 16. Etomidate given for CT.  The following imaging was completed:  CT Head and CTA. Airway removed and [place] placed. Patient was oriented to name and place with NIHSS 15. Patient agitated and ativan given. Patient is not a candidate for IV Thrombolytic due to stroke not suspected.   Care Plan: Q2 assessments and EEG.   Bedside handoff with ED RN Billy Thompson.    Billy Thompson Stroke Response RN

## 2023-01-21 NOTE — ED Notes (Signed)
Pt's HR was showing in the high 30s on the monitor. This RN went into the room w ICU nurse practitioner. Pt was found to be in PEA. CPR initiated at 1023.

## 2023-01-21 NOTE — Progress Notes (Signed)
RT transported Pt on ventilator from ED Trauma A to CT and back without any complications. RN/MD at bedside.

## 2023-01-21 DEATH — deceased
